# Patient Record
Sex: Male | Born: 1950 | Race: White | Hispanic: No | Marital: Single
Health system: Southern US, Community
[De-identification: ages and names within clinical notes are randomized; demographics above are authoritative.]

---

## 2011-11-09 ENCOUNTER — Inpatient Hospital Stay: Payer: Self-pay | Admitting: Psychiatry

## 2011-11-09 LAB — DRUG SCREEN, URINE
Amphetamines, Ur Screen: NEGATIVE (ref ?–1000)
Barbiturates, Ur Screen: NEGATIVE (ref ?–200)
Benzodiazepine, Ur Scrn: NEGATIVE (ref ?–200)
Cocaine Metabolite,Ur ~~LOC~~: NEGATIVE (ref ?–300)
MDMA (Ecstasy)Ur Screen: NEGATIVE (ref ?–500)
Opiate, Ur Screen: NEGATIVE (ref ?–300)

## 2011-11-09 LAB — COMPREHENSIVE METABOLIC PANEL
Albumin: 4.3 g/dL (ref 3.4–5.0)
Alkaline Phosphatase: 59 U/L (ref 50–136)
Anion Gap: 9 (ref 7–16)
Bilirubin,Total: 0.6 mg/dL (ref 0.2–1.0)
Calcium, Total: 9.4 mg/dL (ref 8.5–10.1)
Co2: 29 mmol/L (ref 21–32)
Creatinine: 1.01 mg/dL (ref 0.60–1.30)
EGFR (Non-African Amer.): >60
Osmolality: 284 (ref 275–301)
SGOT(AST): 23 U/L (ref 15–37)
SGPT (ALT): 21 U/L
Sodium: 142 mmol/L (ref 136–145)

## 2011-11-09 LAB — SALICYLATE LEVEL: Salicylates, Serum: <1.7 mg/dL

## 2011-11-09 LAB — CBC
HCT: 44.2 % (ref 40.0–52.0)
MCH: 30.9 pg (ref 26.0–34.0)
MCHC: 33.8 g/dL (ref 32.0–36.0)
MCV: 91 fL (ref 80–100)
Platelet: 225 10*3/uL (ref 150–440)
RDW: 13.8 % (ref 11.5–14.5)

## 2011-11-09 LAB — URINALYSIS, COMPLETE
Glucose,UR: NEGATIVE mg/dL (ref 0–75)
Leukocyte Esterase: NEGATIVE
Nitrite: NEGATIVE
Protein: NEGATIVE
Specific Gravity: 1.006 (ref 1.003–1.030)
WBC UR: <1 /HPF (ref 0–5)

## 2011-11-09 LAB — ETHANOL
Ethanol %: <0.003 % (ref 0.000–0.080)
Ethanol: <3 mg/dL

## 2011-11-09 LAB — ACETAMINOPHEN LEVEL: Acetaminophen: <2 ug/mL

## 2011-11-09 LAB — TSH: Thyroid Stimulating Horm: 1.53 u[IU]/mL

## 2011-11-27 LAB — CBC WITH DIFFERENTIAL/PLATELET
Eosinophil %: 3.1 %
Lymphocyte %: 14.1 %
Monocyte %: 4.7 %
Neutrophil %: 77 %
Platelet: 264 10*3/uL (ref 150–440)
RBC: 4.47 10*6/uL (ref 4.40–5.90)
WBC: 8.8 10*3/uL (ref 3.8–10.6)

## 2011-11-27 LAB — URINALYSIS, COMPLETE
Glucose,UR: NEGATIVE mg/dL (ref 0–75)
Ketone: NEGATIVE
Nitrite: NEGATIVE
RBC,UR: NONE SEEN /HPF (ref 0–5)
Specific Gravity: 1.003 (ref 1.003–1.030)
Squamous Epithelial: NONE SEEN
WBC UR: <1 /HPF (ref 0–5)

## 2011-11-27 LAB — BASIC METABOLIC PANEL
Anion Gap: 8 (ref 7–16)
BUN: 13 mg/dL (ref 7–18)
Calcium, Total: 9.1 mg/dL (ref 8.5–10.1)
Creatinine: 1.1 mg/dL (ref 0.60–1.30)
EGFR (African American): >60
EGFR (Non-African Amer.): >60
Osmolality: 278 (ref 275–301)
Sodium: 139 mmol/L (ref 136–145)

## 2011-12-17 ENCOUNTER — Inpatient Hospital Stay: Payer: Self-pay | Admitting: Psychiatry

## 2011-12-17 LAB — CBC
Platelet: 246 10*3/uL (ref 150–440)
RBC: 4.93 10*6/uL (ref 4.40–5.90)
RDW: 13.3 % (ref 11.5–14.5)
WBC: 13.1 10*3/uL — ABNORMAL HIGH (ref 3.8–10.6)

## 2011-12-17 LAB — URINALYSIS, COMPLETE
Bacteria: NONE SEEN
Bilirubin,UR: NEGATIVE
Blood: NEGATIVE
Ketone: NEGATIVE
Ph: 5 (ref 4.5–8.0)
Protein: NEGATIVE
RBC,UR: 1 /HPF (ref 0–5)
Specific Gravity: 1.012 (ref 1.003–1.030)
Squamous Epithelial: NONE SEEN
WBC UR: NONE SEEN /HPF (ref 0–5)

## 2011-12-17 LAB — COMPREHENSIVE METABOLIC PANEL
Anion Gap: 12 (ref 7–16)
Bilirubin,Total: 0.3 mg/dL (ref 0.2–1.0)
Co2: 29 mmol/L (ref 21–32)
Creatinine: 1.12 mg/dL (ref 0.60–1.30)
EGFR (African American): >60
EGFR (Non-African Amer.): >60
Glucose: 102 mg/dL — ABNORMAL HIGH (ref 65–99)
Osmolality: 285 (ref 275–301)
Sodium: 142 mmol/L (ref 136–145)
Total Protein: 8 g/dL (ref 6.4–8.2)

## 2011-12-17 LAB — DRUG SCREEN, URINE
Amphetamines, Ur Screen: NEGATIVE (ref ?–1000)
Benzodiazepine, Ur Scrn: NEGATIVE (ref ?–200)
Cocaine Metabolite,Ur ~~LOC~~: NEGATIVE (ref ?–300)
MDMA (Ecstasy)Ur Screen: NEGATIVE (ref ?–500)

## 2011-12-17 LAB — ACETAMINOPHEN LEVEL: Acetaminophen: <2 ug/mL

## 2011-12-17 LAB — ETHANOL: Ethanol %: <0.003 % (ref 0.000–0.080)

## 2011-12-31 ENCOUNTER — Ambulatory Visit: Payer: Self-pay | Admitting: Unknown Physician Specialty

## 2012-01-19 ENCOUNTER — Ambulatory Visit: Payer: Self-pay | Admitting: Psychiatry

## 2012-02-18 ENCOUNTER — Ambulatory Visit: Payer: Self-pay | Admitting: Psychiatry

## 2012-03-20 ENCOUNTER — Ambulatory Visit: Payer: Self-pay | Admitting: Psychiatry

## 2012-04-05 IMAGING — CR DG CHEST 2V
1 series · 3 of 3 positions shown · non-contrast
Comparison: none

REASON FOR EXAM: ECT workup
COMMENTS:

PROCEDURE:     DXR - DXR CHEST PA (OR AP) AND LATERAL  - November 27, 2011  [DATE]
RESULT:     The lung fields are clear. No pneumonia, pneumothorax or pleural
effusion is seen. Heart size is normal. No acute bony abnormalities are
seen. No vertebral body fracture is observed.

[Series 1: pa · 0.17mm/px · 3 of 3 slices shown]
[im 1/3]
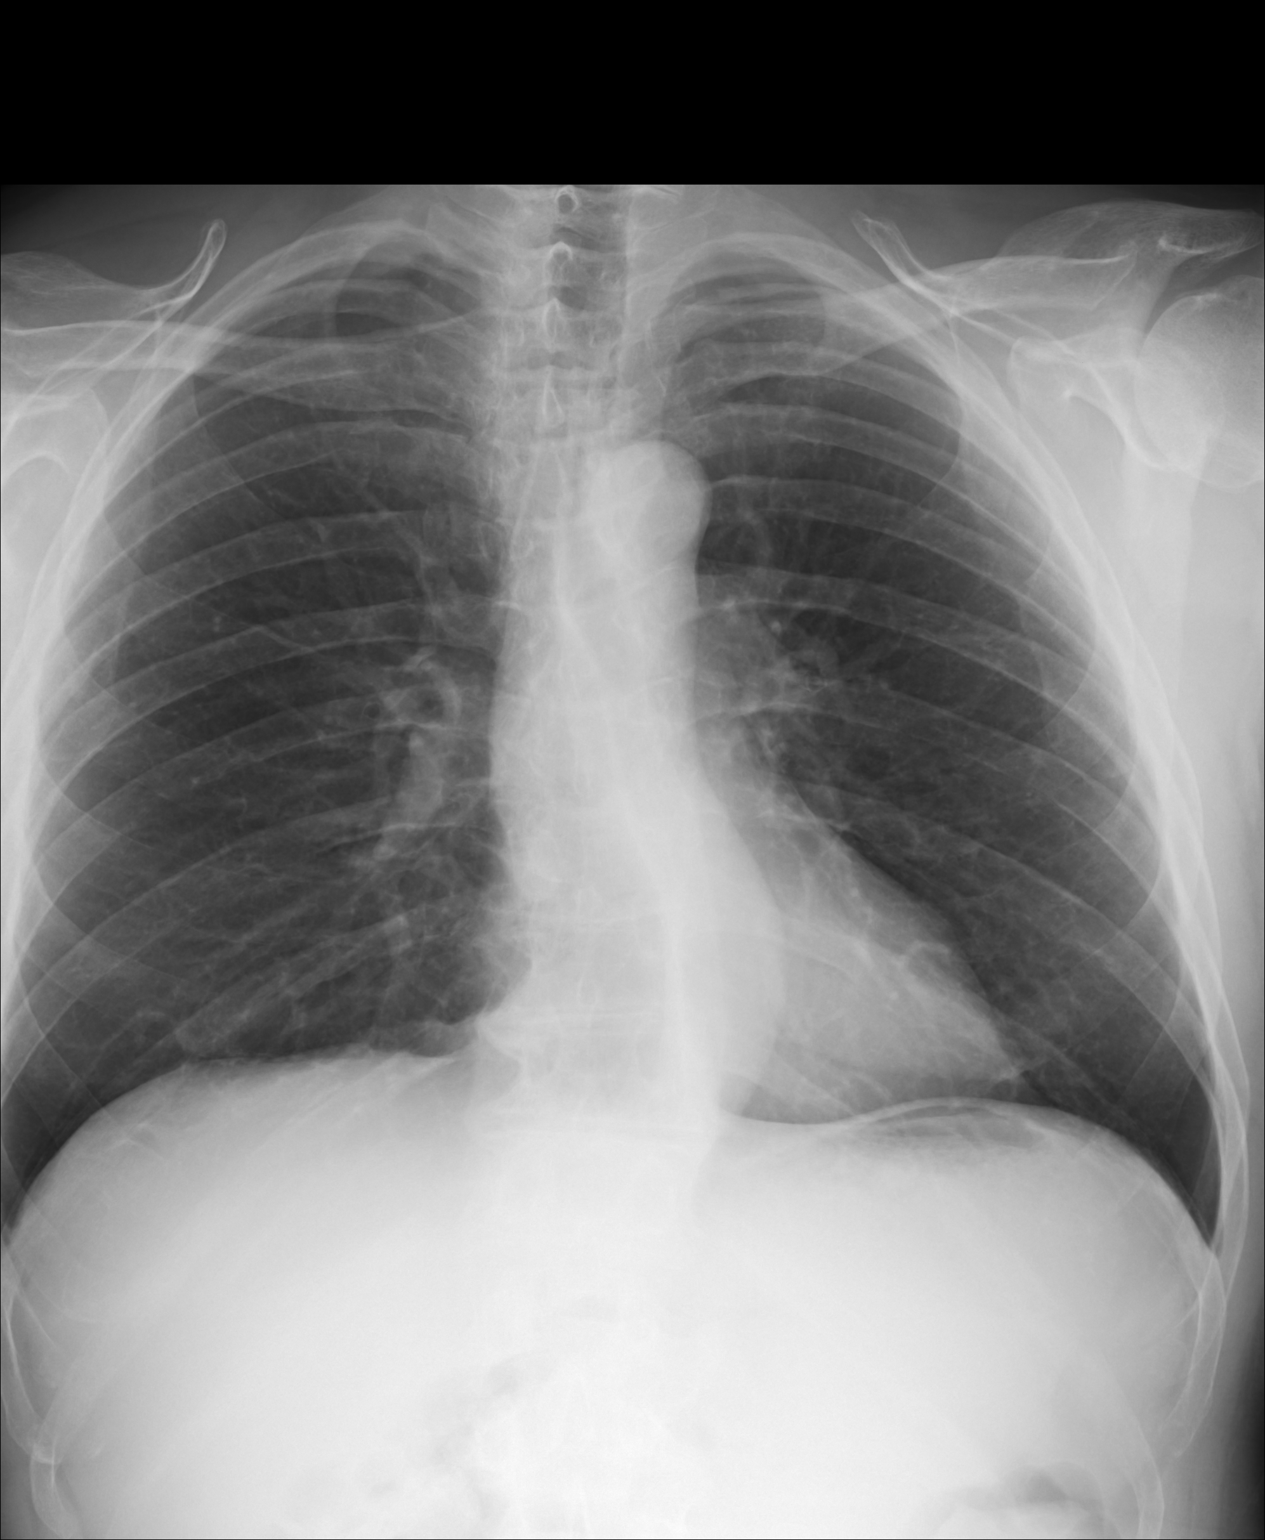
[im 2/3]
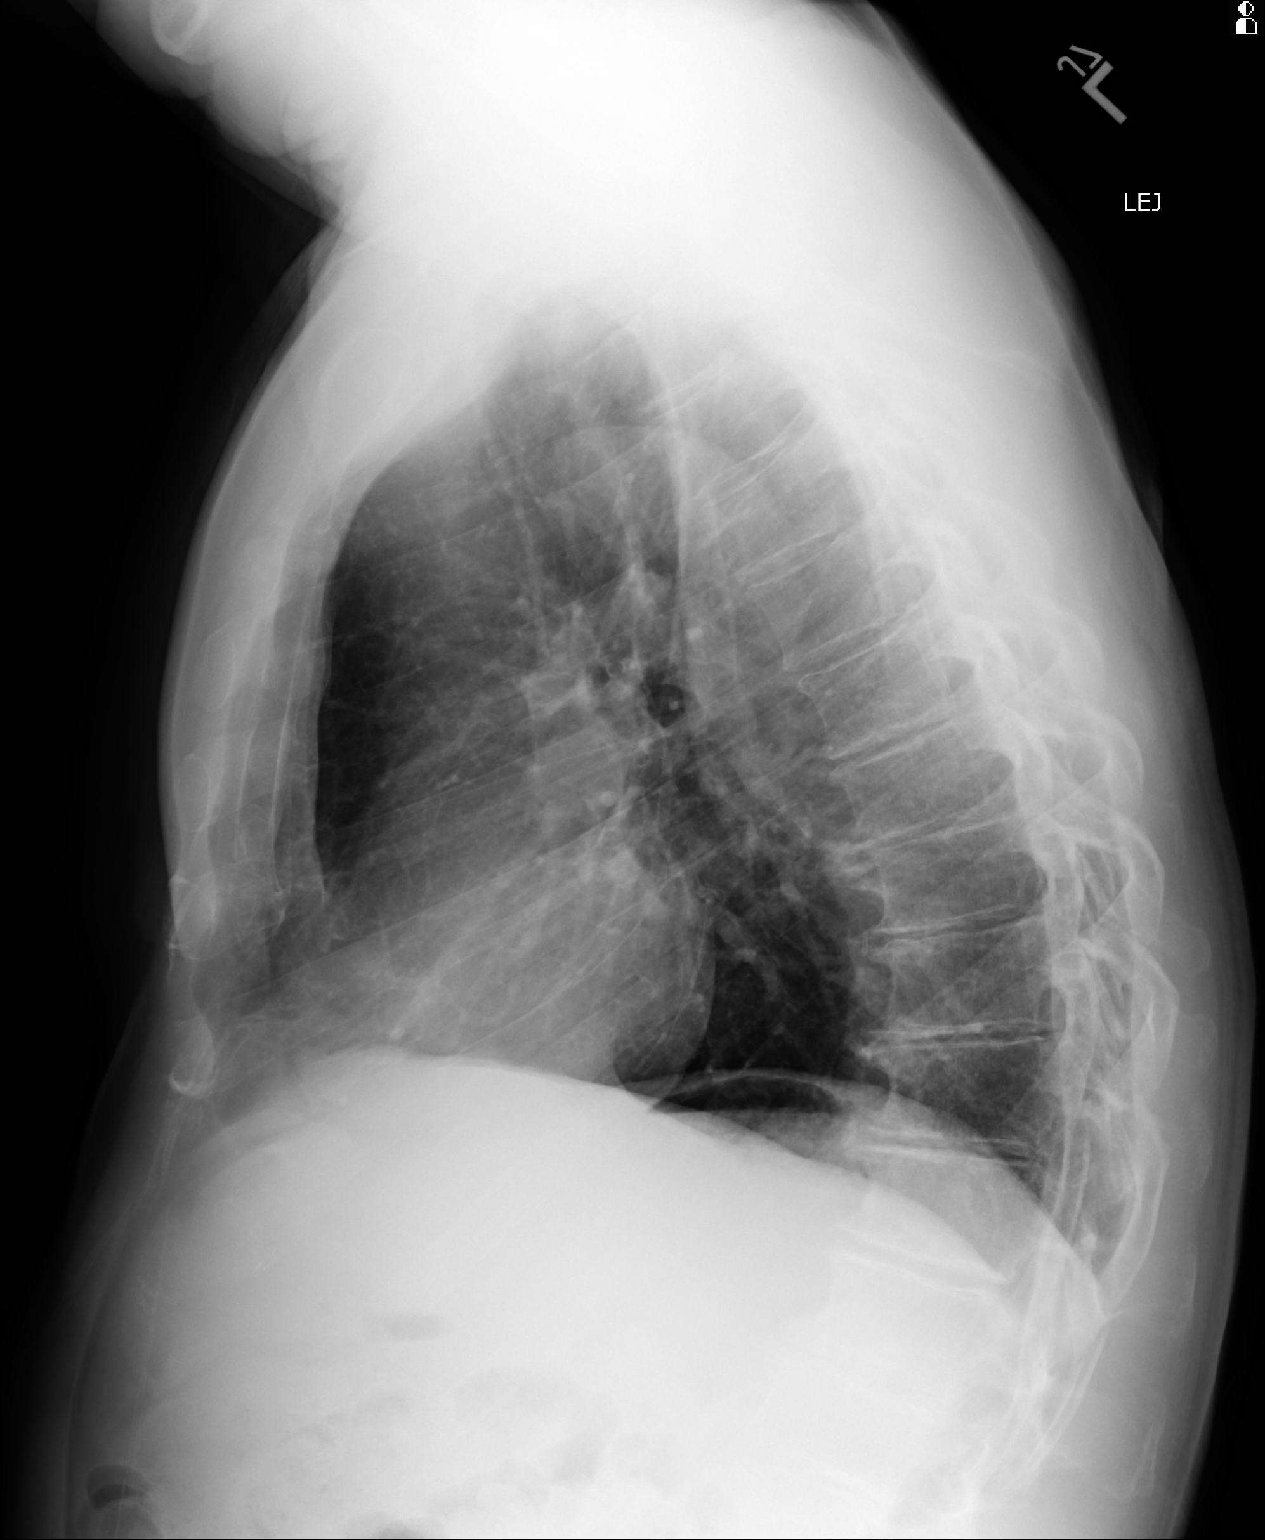
[im 3/3]
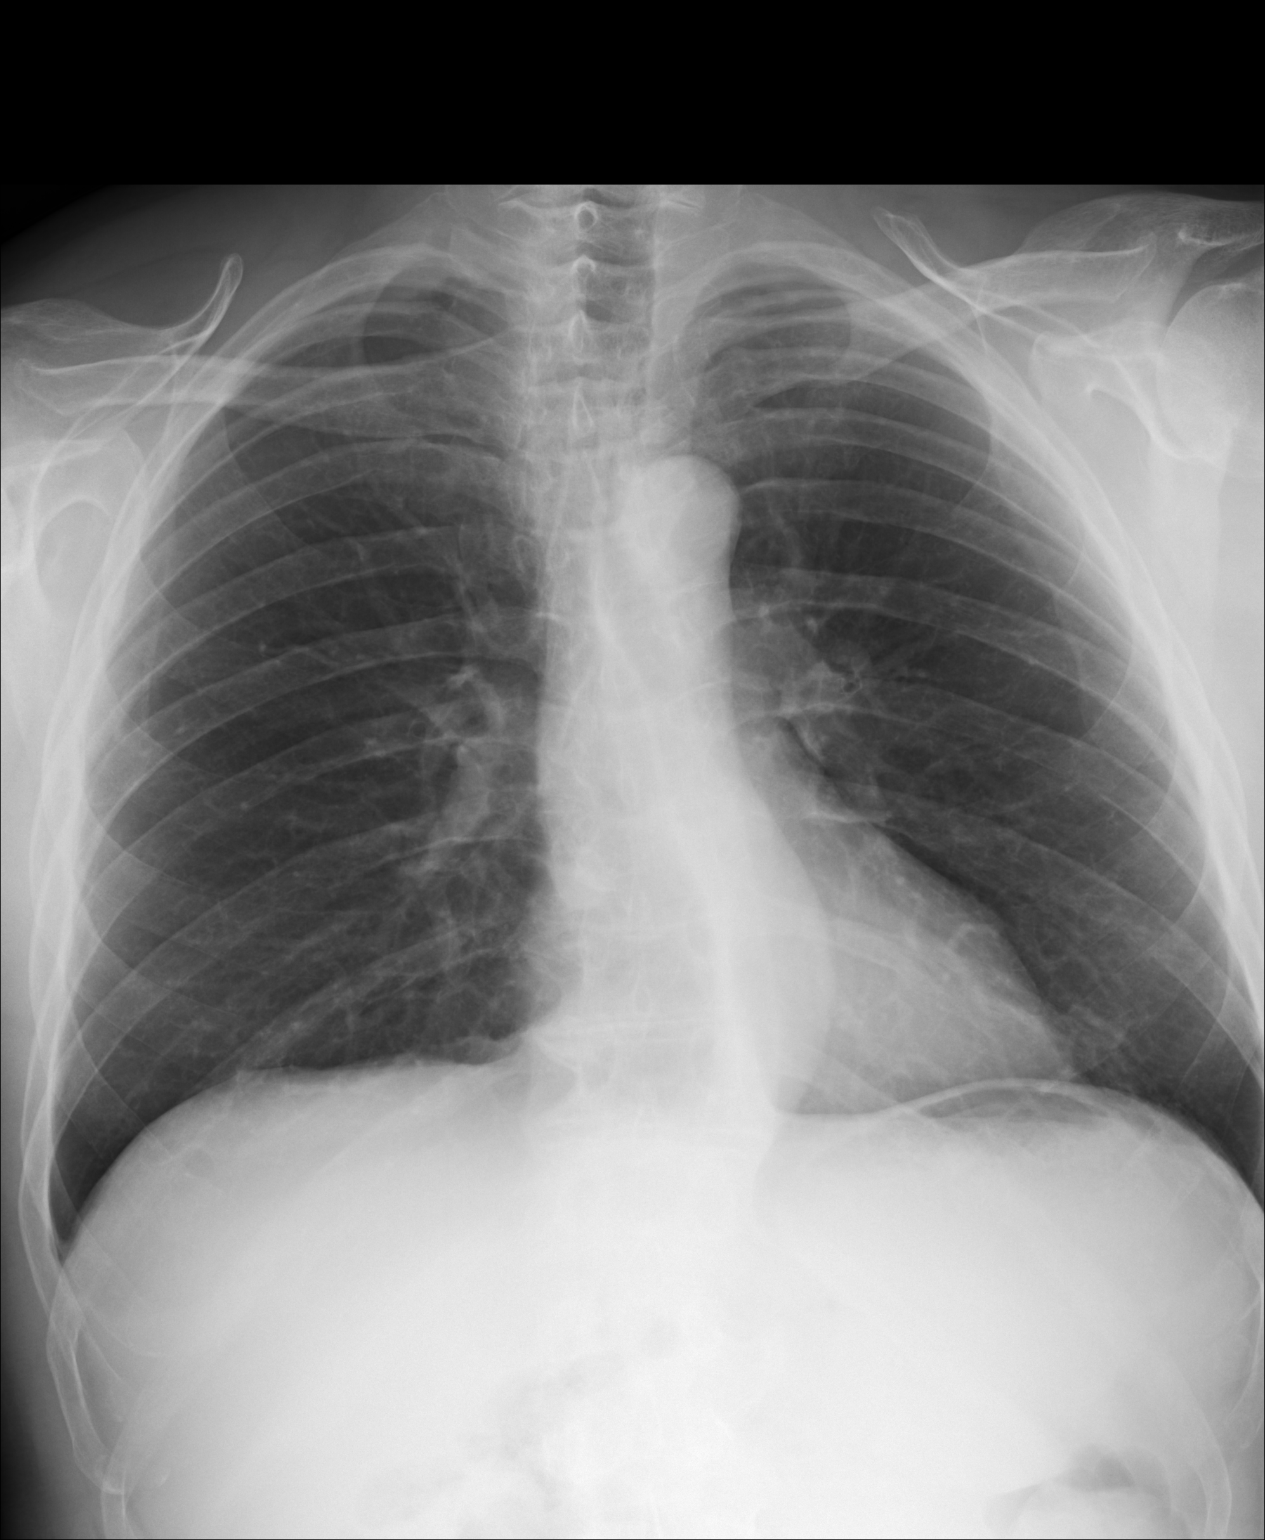

[3 of 3 positions shown; findings below may reference images not displayed]

IMPRESSION: No acute changes are identified.

## 2012-05-05 ENCOUNTER — Ambulatory Visit: Payer: Self-pay | Admitting: Psychiatry

## 2012-05-12 ENCOUNTER — Ambulatory Visit: Payer: Self-pay | Admitting: Emergency Medicine

## 2012-05-12 LAB — HEPATIC FUNCTION PANEL A (ARMC)
SGOT(AST): 15 U/L (ref 15–37)
SGPT (ALT): 20 U/L
Total Protein: 7.3 g/dL (ref 6.4–8.2)

## 2012-05-18 ENCOUNTER — Ambulatory Visit: Payer: Self-pay | Admitting: Emergency Medicine

## 2012-05-21 ENCOUNTER — Inpatient Hospital Stay: Payer: Self-pay | Admitting: Emergency Medicine

## 2012-05-21 LAB — COMPREHENSIVE METABOLIC PANEL
Alkaline Phosphatase: 92 U/L (ref 50–136)
BUN: 13 mg/dL (ref 7–18)
Bilirubin,Total: 0.4 mg/dL (ref 0.2–1.0)
Chloride: 103 mmol/L (ref 98–107)
Co2: 28 mmol/L (ref 21–32)
Creatinine: 1.32 mg/dL — ABNORMAL HIGH (ref 0.60–1.30)
EGFR (African American): >60
Osmolality: 284 (ref 275–301)
Potassium: 4 mmol/L (ref 3.5–5.1)
SGPT (ALT): 19 U/L
Sodium: 141 mmol/L (ref 136–145)
Total Protein: 7.5 g/dL (ref 6.4–8.2)

## 2012-05-21 LAB — URINALYSIS, COMPLETE
Bacteria: NONE SEEN
Bilirubin,UR: NEGATIVE
Glucose,UR: NEGATIVE mg/dL (ref 0–75)
Leukocyte Esterase: NEGATIVE
Ph: 6 (ref 4.5–8.0)
Protein: NEGATIVE
RBC,UR: <1 /HPF (ref 0–5)
Specific Gravity: 1.014 (ref 1.003–1.030)
Squamous Epithelial: NONE SEEN
WBC UR: 1 /HPF (ref 0–5)

## 2012-05-21 LAB — CBC
HCT: 41.4 % (ref 40.0–52.0)
MCH: 29.8 pg (ref 26.0–34.0)
MCHC: 34.4 g/dL (ref 32.0–36.0)
MCV: 87 fL (ref 80–100)
RBC: 4.77 10*6/uL (ref 4.40–5.90)
RDW: 14.4 % (ref 11.5–14.5)
WBC: 15.5 10*3/uL — ABNORMAL HIGH (ref 3.8–10.6)

## 2012-05-21 LAB — LIPASE, BLOOD: Lipase: 102 U/L (ref 73–393)

## 2012-05-24 LAB — CBC WITH DIFFERENTIAL/PLATELET
Basophil #: 0 10*3/uL (ref 0.0–0.1)
Basophil %: 0.3 %
Eosinophil %: 4.7 %
HCT: 34.9 % — ABNORMAL LOW (ref 40.0–52.0)
HGB: 12.3 g/dL — ABNORMAL LOW (ref 13.0–18.0)
Lymphocyte #: 1.3 10*3/uL (ref 1.0–3.6)
MCV: 87 fL (ref 80–100)
Monocyte #: 0.6 x10 3/mm (ref 0.2–1.0)
Neutrophil #: 6.7 10*3/uL — ABNORMAL HIGH (ref 1.4–6.5)
Platelet: 190 10*3/uL (ref 150–440)
RBC: 4.02 10*6/uL — ABNORMAL LOW (ref 4.40–5.90)
RDW: 14.1 % (ref 11.5–14.5)
WBC: 9 10*3/uL (ref 3.8–10.6)

## 2012-05-24 LAB — HEPATIC FUNCTION PANEL A (ARMC)
Albumin: 2.8 g/dL — ABNORMAL LOW (ref 3.4–5.0)
Bilirubin, Direct: 0.3 mg/dL — ABNORMAL HIGH (ref 0.00–0.20)
SGPT (ALT): 48 U/L (ref 12–78)

## 2012-06-10 ENCOUNTER — Observation Stay: Payer: Self-pay | Admitting: Emergency Medicine

## 2012-06-10 LAB — CBC
HGB: 13.2 g/dL (ref 13.0–18.0)
MCH: 29 pg (ref 26.0–34.0)
MCV: 87 fL (ref 80–100)
Platelet: 505 10*3/uL — ABNORMAL HIGH (ref 150–440)
RBC: 4.57 10*6/uL (ref 4.40–5.90)
RDW: 14.1 % (ref 11.5–14.5)
WBC: 10.1 10*3/uL (ref 3.8–10.6)

## 2012-06-10 LAB — COMPREHENSIVE METABOLIC PANEL
Albumin: 3.4 g/dL (ref 3.4–5.0)
Alkaline Phosphatase: 501 U/L — ABNORMAL HIGH (ref 50–136)
Anion Gap: 7 (ref 7–16)
Bilirubin,Total: 0.5 mg/dL (ref 0.2–1.0)
Calcium, Total: 9.6 mg/dL (ref 8.5–10.1)
Chloride: 102 mmol/L (ref 98–107)
Creatinine: 1.18 mg/dL (ref 0.60–1.30)
Osmolality: 276 (ref 275–301)
Potassium: 4 mmol/L (ref 3.5–5.1)
SGOT(AST): 31 U/L (ref 15–37)
Sodium: 138 mmol/L (ref 136–145)

## 2012-06-10 LAB — URINALYSIS, COMPLETE
Bilirubin,UR: NEGATIVE
Blood: NEGATIVE
Glucose,UR: NEGATIVE mg/dL (ref 0–75)
Hyaline Cast: 4
Ketone: NEGATIVE
Nitrite: NEGATIVE
Ph: 7 (ref 4.5–8.0)
Protein: 30
Specific Gravity: 1.012 (ref 1.003–1.030)
WBC UR: 2 /HPF (ref 0–5)

## 2012-06-10 LAB — LIPASE, BLOOD: Lipase: 218 U/L (ref 73–393)

## 2012-06-11 LAB — APTT: Activated PTT: 32.6 secs (ref 23.6–35.9)

## 2012-06-16 ENCOUNTER — Ambulatory Visit: Payer: Self-pay | Admitting: Psychiatry

## 2012-06-23 ENCOUNTER — Ambulatory Visit: Payer: Self-pay | Admitting: Psychiatry

## 2012-07-20 ENCOUNTER — Ambulatory Visit: Payer: Self-pay | Admitting: Psychiatry

## 2012-09-28 IMAGING — CR DG CHEST 1V PORT
1 series · 1 of 1 positions shown · non-contrast
Comparison: none

REASON FOR EXAM: pain
COMMENTS:

[portable]
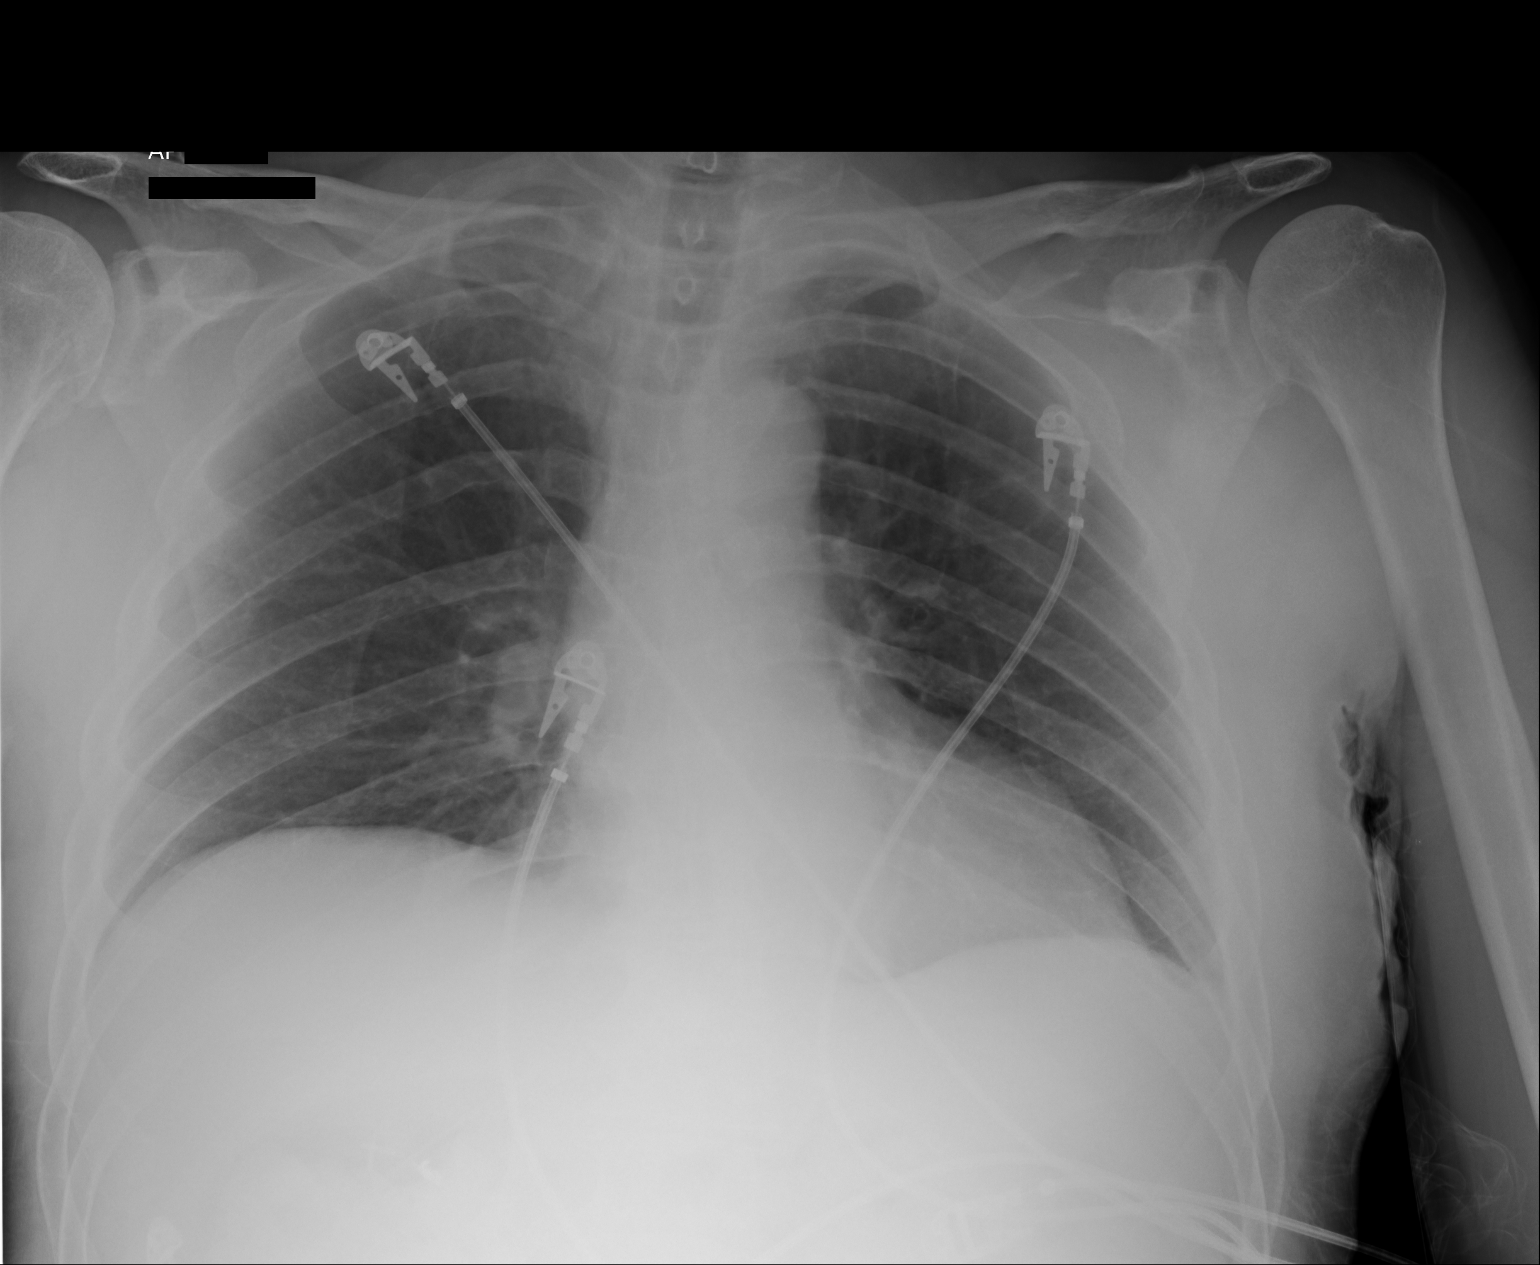

[1 of 1 positions shown; findings below may reference images not displayed]

PROCEDURE:     DXR - DXR PORTABLE CHEST SINGLE VIEW  - May 21, 2012  [DATE]

RESULT:     There is hypoinflation. The lungs are clear. The heart and
pulmonary vessels are normal. The bony and mediastinal structures are
unremarkable. There is no effusion. There is no pneumothorax or evidence of
congestive failure.
IMPRESSION: No acute cardiopulmonary disease.

[REDACTED]

## 2012-10-18 IMAGING — CT CT ABD-PELV W/ CM
1 of 2 series · 15 of 32 positions shown, 19 images · IV contrast (isovue)
Comparison: 05/21/2012

REASON FOR EXAM: (1) assess perihepatic fluid; (2) assess perihepatic
fluid
COMMENTS:

PROCEDURE:     CT  - CT ABDOMEN / PELVIS  W  - June 10, 2012  [DATE]
RESULT:     History: Perihepatic fluid
TECHNIQUE: Multiple axial images of the abdomen and pelvis were performed
from the lung bases to the pubic symphysis, without p.o. contrast and with
85 ml of Isovue 300 intravenous contrast.

[Series 2: 3mm soft tissue · axial · 0.75mm/px · z∈[-1097,-692]mm · 15 of 147 slices shown, 19 images]
[im 6/147  soft-tissue]
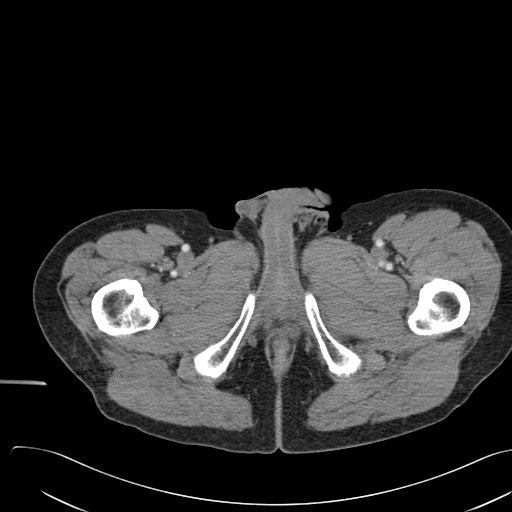
[im 6/147  bone]
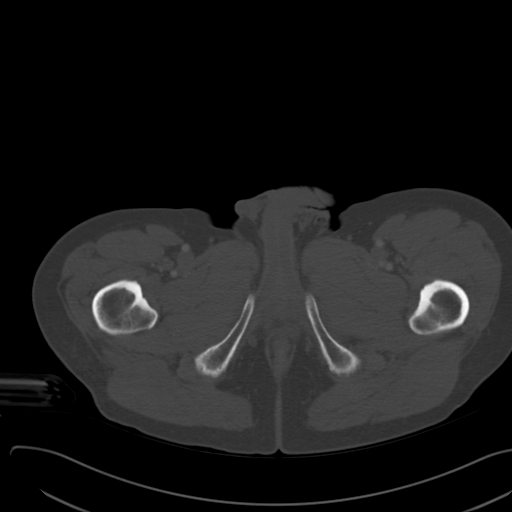
[im 18/147  soft-tissue]
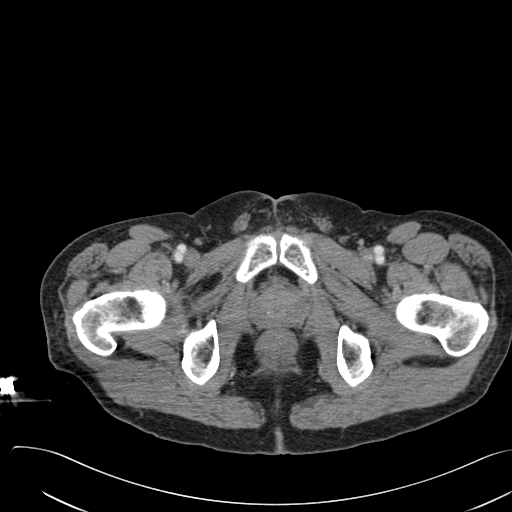
[im 30/147  soft-tissue]
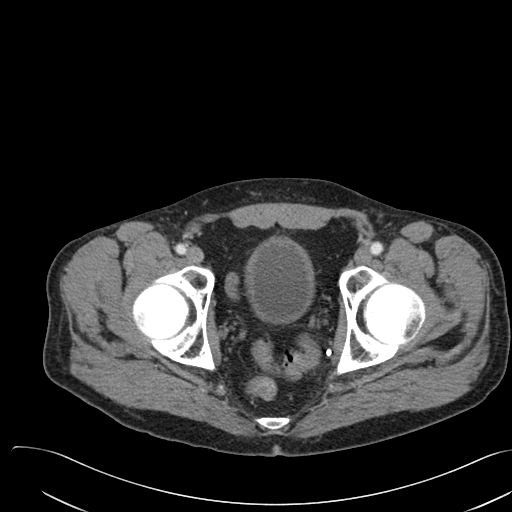
[im 41/147  soft-tissue]
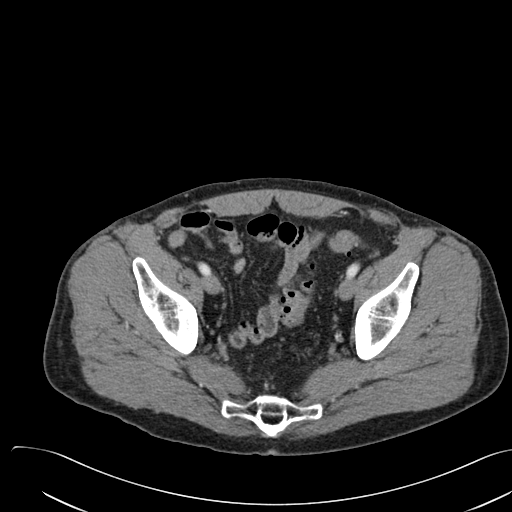
[im 53/147  soft-tissue]
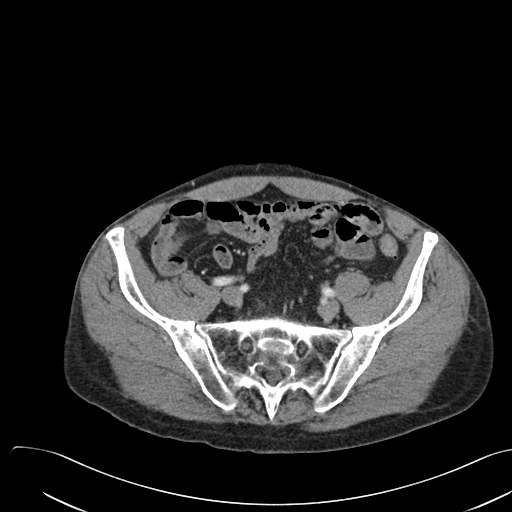
[im 65/147  soft-tissue]
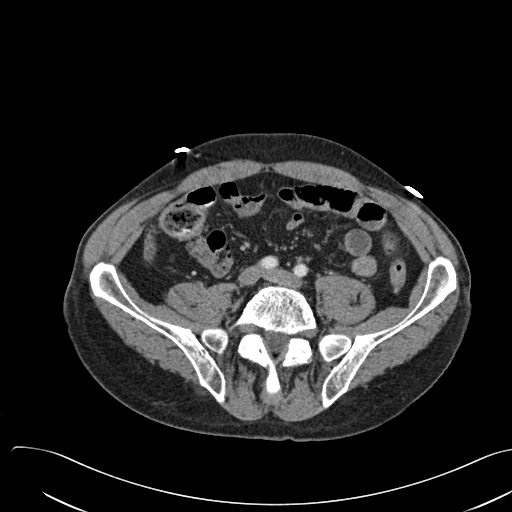
[im 76/147  soft-tissue]
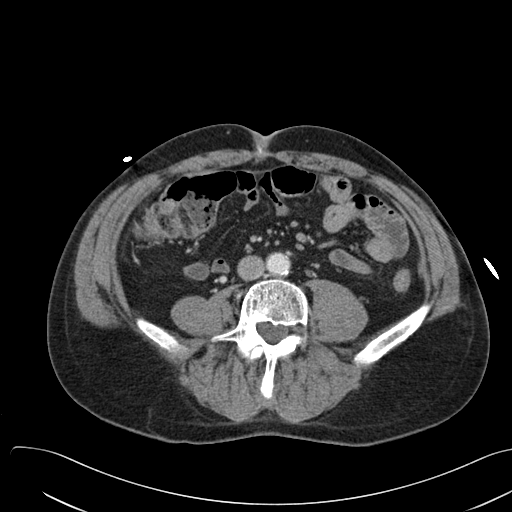
[im 82/147  soft-tissue]
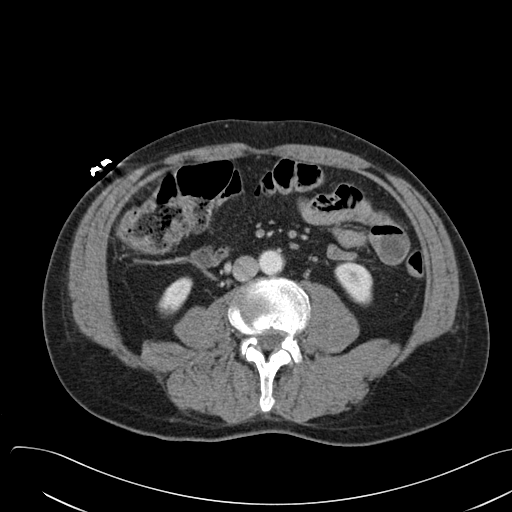
[im 94/147  soft-tissue]
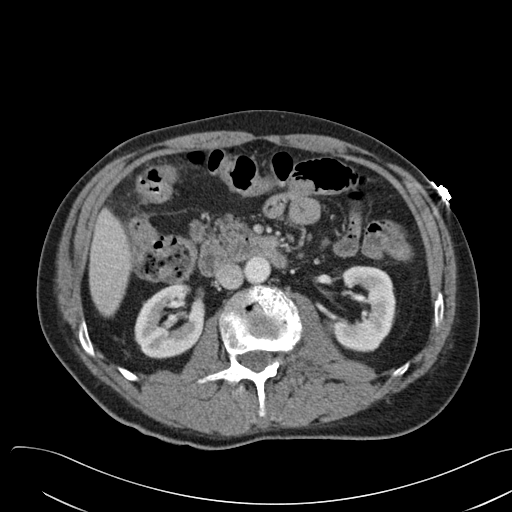
[im 94/147  bone]
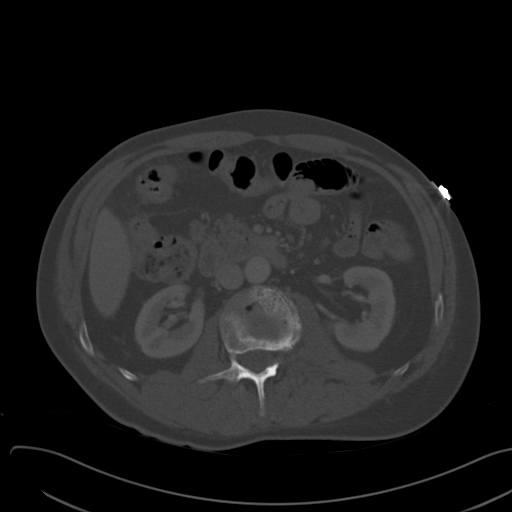
[im 106/147  soft-tissue]
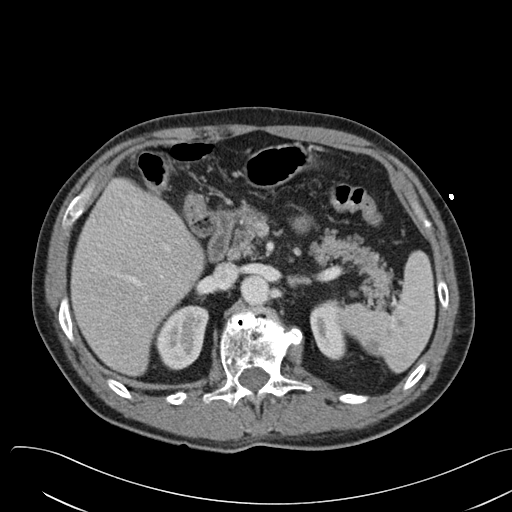
[im 117/147  soft-tissue]
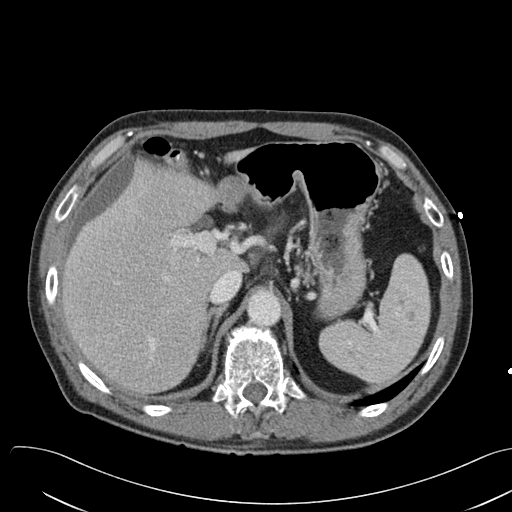
[im 123/147  lung]
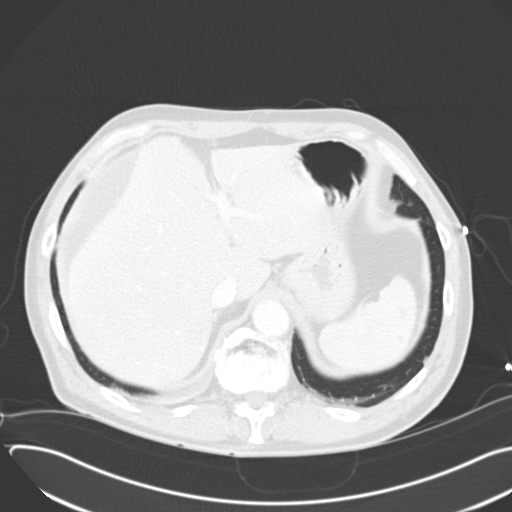
[im 129/147  soft-tissue]
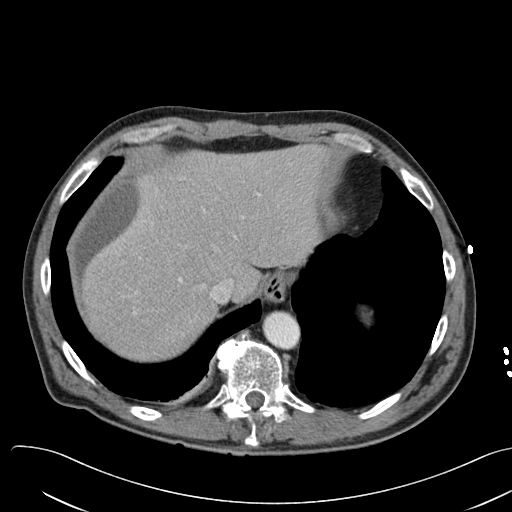
[im 129/147  lung]
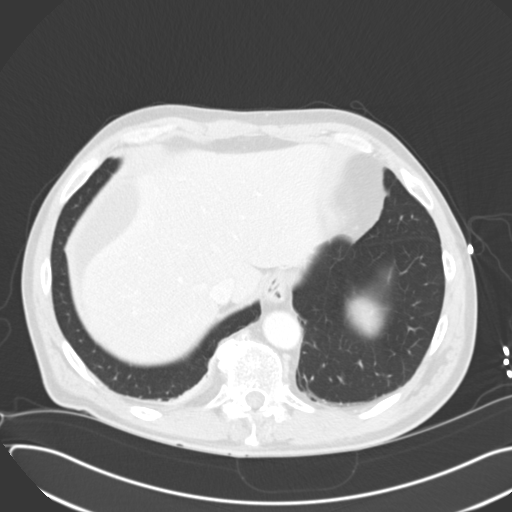
[im 135/147  lung]
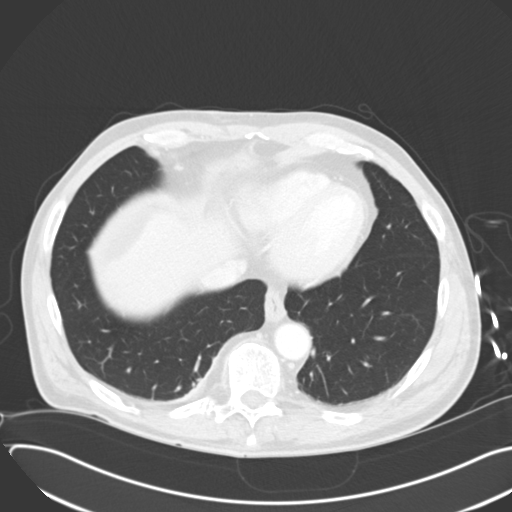
[im 141/147  soft-tissue]
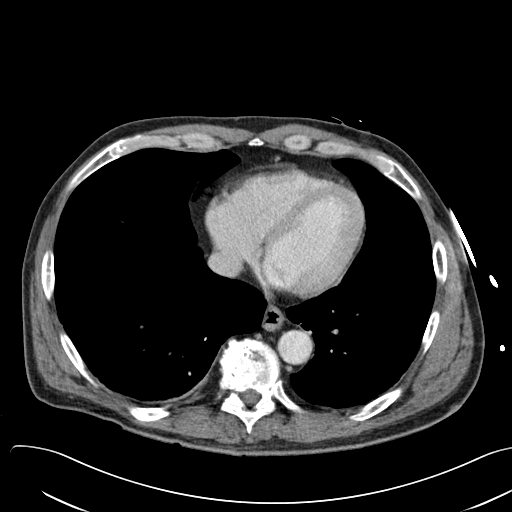
[im 141/147  lung]
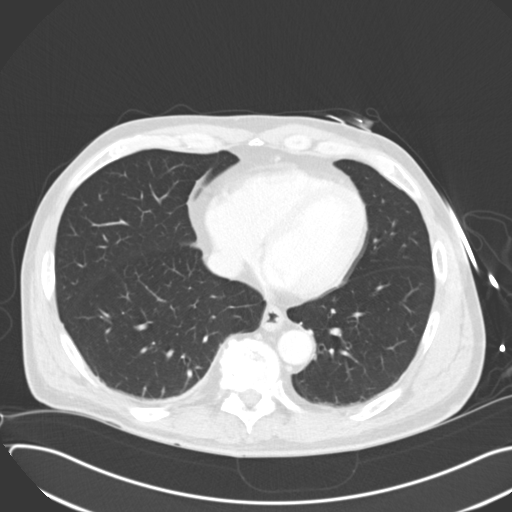

[15 of 32 positions shown; findings below may reference images not displayed]

FINDINGS: The lung bases are clear. There is no pneumothorax. The heart size is
normal.

The liver demonstrates no focal abnormality. There is no intrahepatic or
extrahepatic biliary ductal dilatation. The gallbladder is surgically
absent. There is perihepatic fluid located anterolaterally deforming the
liver capsule suggesting that the fluid is loculate, measuring 10.5 x
cm. The spleen demonstrates no focal abnormality. The kidneys, adrenal
glands, and pancreas are normal. The bladder is unremarkable.

The stomach, duodenum, small intestine, and large intestine demonstrate no
gross abnormality. There is a normal caliber appendix in the right lower
quadrant without periappendiceal inflammatory changes. There is
diverticulosis without evidence of diverticulitis. There is no
pneumoperitoneum, pneumatosis, or portal venous gas. There is no abdominal
or pelvic free fluid. There is no lymphadenopathy.

The abdominal aorta is normal in caliber with atherosclerosis.

The osseous structures are unremarkable.
IMPRESSION: 1. There is perihepatic fluid located anterolaterally deforming the liver
capsule suggesting that the fluid is loculated, measuring 10.5 x 1.9 cm.
This may represent a loculated biloma versus free fluid versus an abscess.
There is no abdominal free fluid.

[REDACTED]

## 2012-10-18 IMAGING — US ABDOMEN ULTRASOUND LIMITED
1 series · 14 of 25 positions shown · non-contrast
Comparison: none

REASON FOR EXAM: s/p complicated cholecystectomy, very elevated alk phos
COMMENTS:   Body Site: GB and Fossa, CBD, Head of Pancreas

PROCEDURE:     US  - US ABDOMEN LIMITED SURVEY  - June 10, 2012  [DATE]
RESULT:     Comparison: None
TECHNIQUE: Multiple gray-scale and color-flow Doppler images of the right
upper quadrant are presented for review.

[Series 1: abdomen ultrasound limited · 0.25mm/px · 42 acquisitions, 14 frames shown]
[im 1/42]
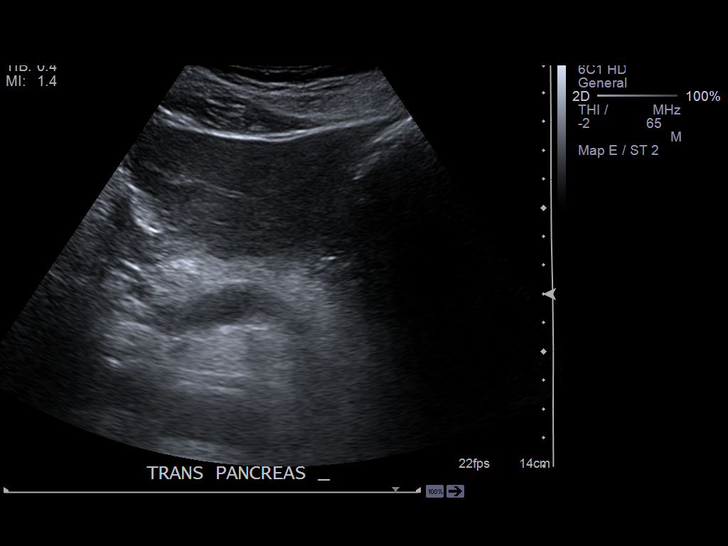
[im 4/42]
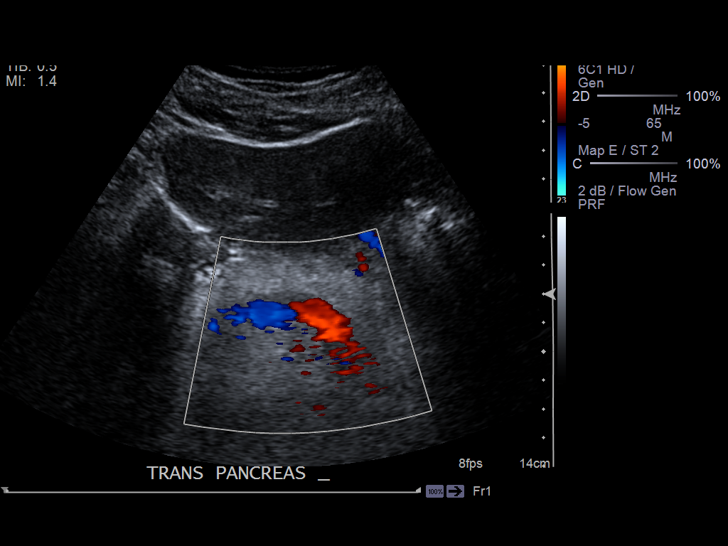
[im 7/42]
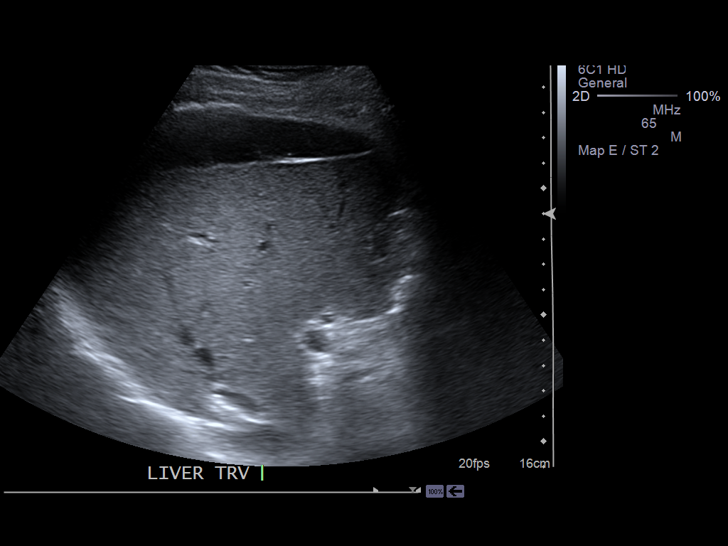
[im 11/42]
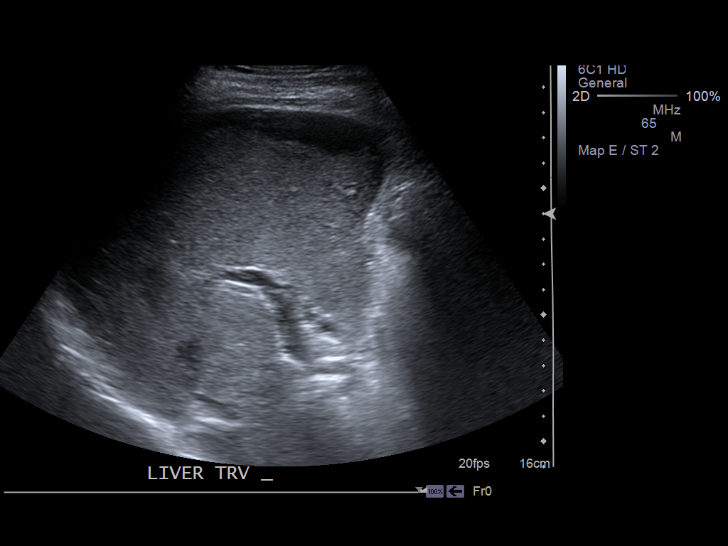
[im 14/42]
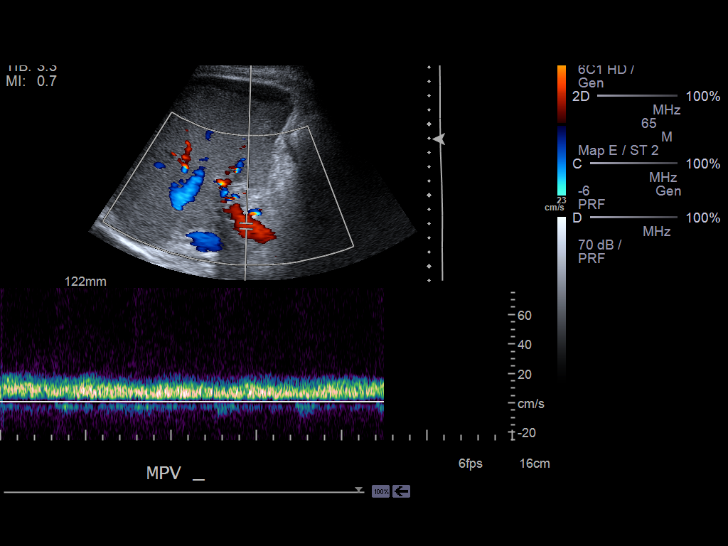
[im 16/42]
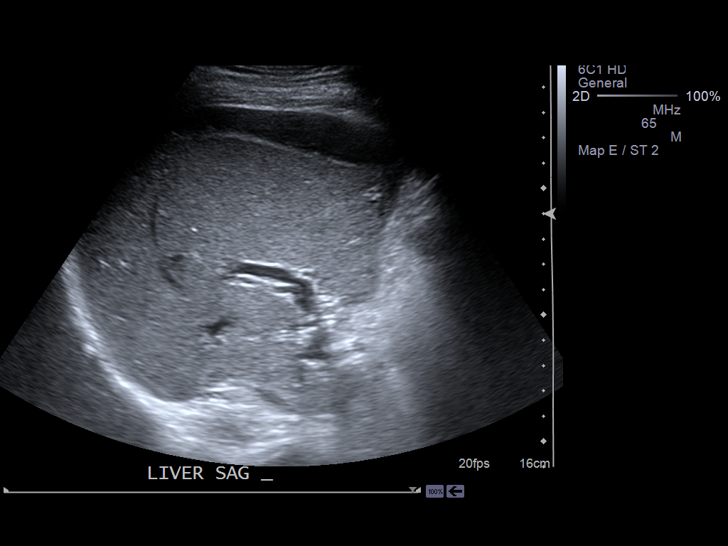
[im 19/42]
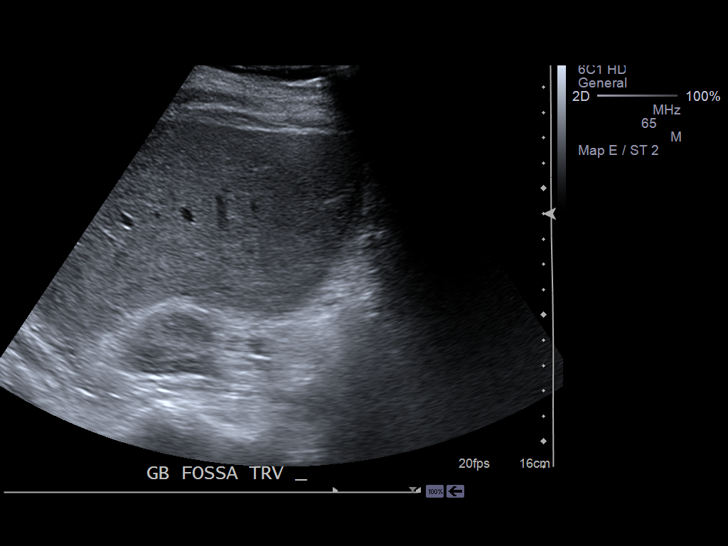
[im 23/42]
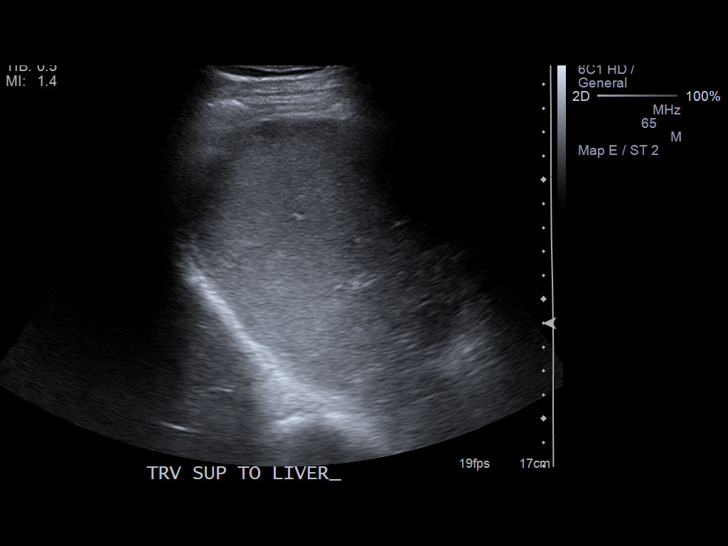
[im 26/42]
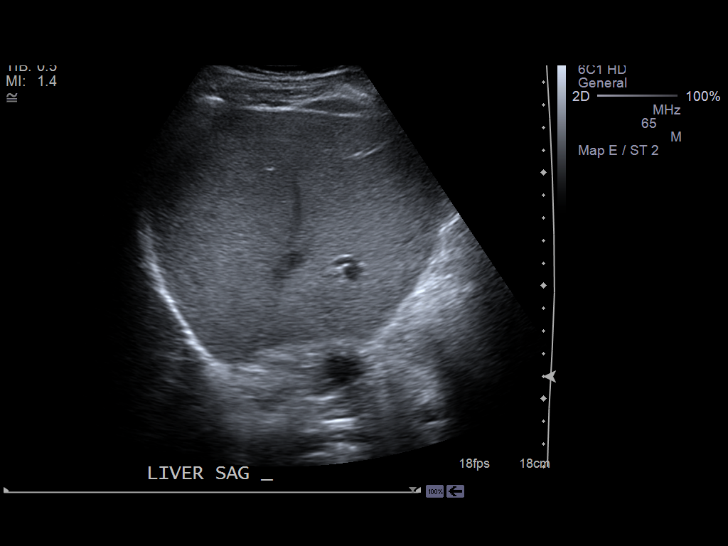
[im 28/42]
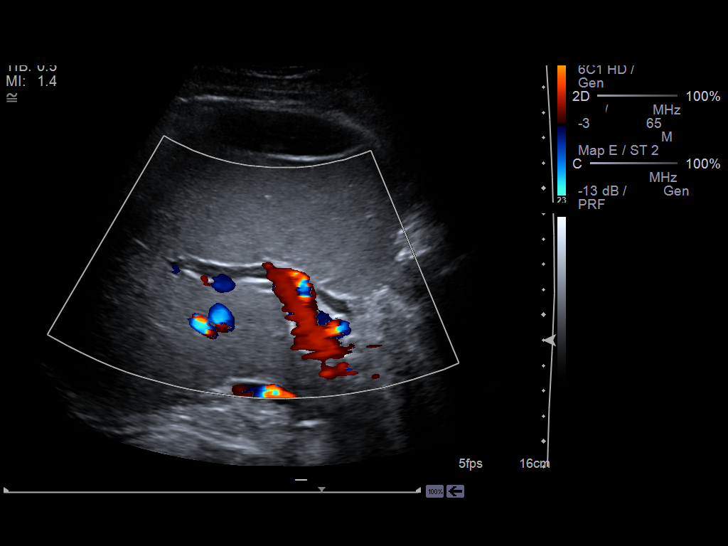
[im 31/42]
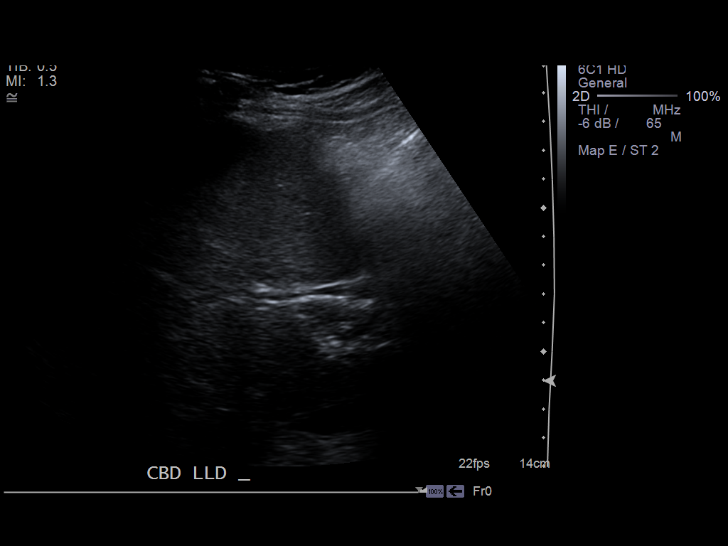
[im 35/42]
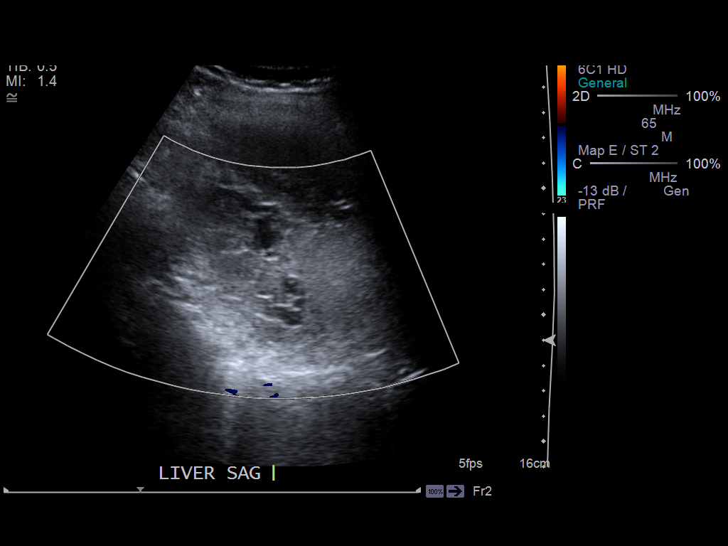
[im 38/42]
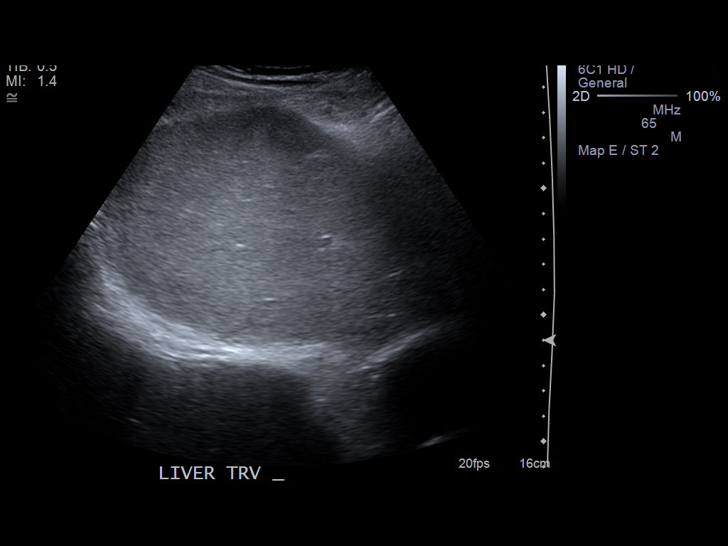
[im 42/42]
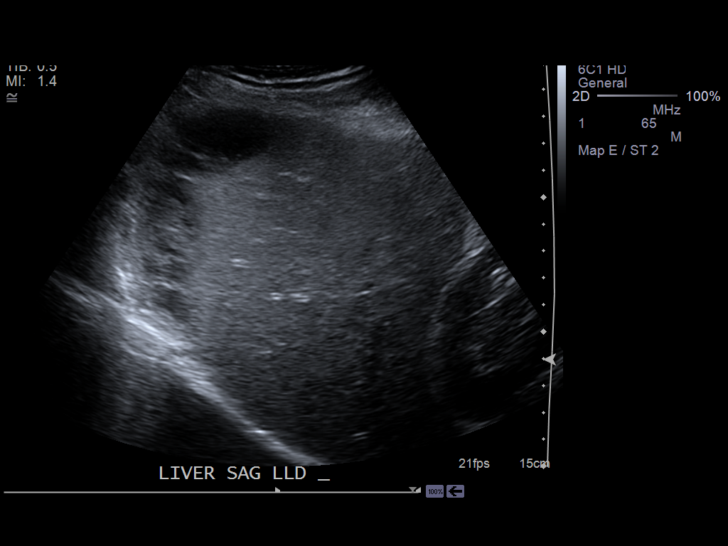

[14 of 25 positions shown; findings below may reference images not displayed]

FINDINGS: Visualized portions of the liver demonstrate normal echogenicity and normal
contours. The liver is without evidence of a focal hepatic lesion.

There is no intra- or extrahepatic biliary ductal dilatation. The common
duct measures 4.2 mm in maximal diameter. Patient is status post
cholecystectomy. There is perihepatic free fluid with a few internal
septations.

The visualized portion of the pancreas is normal in echogenicity.
IMPRESSION: Prior cholecystectomy. There is perihepatic free fluid which is nonspecific
and may represent simple ascites versus mild versus abscess. Further
evaluation with a CT of the abdomen and possible HIDA scan is recommended.

[REDACTED]

## 2015-02-06 NOTE — Consult Note (Signed)
Chief Complaint:   Subjective/Chief Complaint mild nausea, minimal pain when lying in the bed, some epigastric pain on movement, no emesis.   VITAL SIGNS/ANCILLARY NOTES: **Vital Signs.:   04-Aug-13 05:36   Vital Signs Type Routine   Temperature Temperature (F) 98.2   Celsius 36.7   Temperature Source Oral   Pulse Pulse 77   Respirations Respirations 18   Systolic BP Systolic BP 127   Diastolic BP (mmHg) Diastolic BP (mmHg) 83   Mean BP 97   Pulse Ox % Pulse Ox % 93   Pulse Ox Activity Level  At rest   Oxygen Delivery Room Air/ 21 %    14:28   Vital Signs Type Routine   Temperature Temperature (F) 98.7   Celsius 37   Temperature Source oral   Pulse Pulse 80   Respirations Respirations 17   Systolic BP Systolic BP 132   Diastolic BP (mmHg) Diastolic BP (mmHg) 84   Mean BP 100   Pulse Ox % Pulse Ox % 96   Pulse Ox Activity Level  At rest   Oxygen Delivery Room Air/ 21 %   Brief Assessment:   Cardiac Regular    Respiratory clear BS    Gastrointestinal details normal Soft  Nondistended  No masses palpable  Bowel sounds normal  No rebound tenderness  mild ruq and epigastric tenderness to palpation.   Assessment/Plan:  Assessment/Plan:   Assessment 1) abdominal pain post lccy, probable bile duct leak.  failed ercp due to swollen ampulla.  patient about the same as yesterday.    Plan 1) will check labs in am, will discuss case with Dr Bluford Kaufmannh in am. continue current.   Electronic Signatures: Barnetta ChapelSkulskie, Darrill Vreeland (MD)  (Signed 04-Aug-13 19:13)  Authored: Chief Complaint, VITAL SIGNS/ANCILLARY NOTES, Brief Assessment, Assessment/Plan   Last Updated: 04-Aug-13 19:13 by Barnetta ChapelSkulskie, Oletta Buehring (MD)

## 2015-02-06 NOTE — Op Note (Signed)
PATIENT NAME:  Omar Foster, Omar Foster MR#:  [id number] DATE OF BIRTH:  [birth year]  DATE OF PROCEDURE:  05/18/2012  PREOPERATIVE DIAGNOSIS: Cholecystitis.  POSTOPERATIVE DIAGNOSIS: Cholecystitis.  PROCEDURES PERFORMED: Laparoscopic cholecystectomy with cholangiogram and repair of umbilical hernia. The patient's umbilical hernia was found during surgery.  SURGEON: Jovita GammaMasud Marlei Glomski, MD  INDICATION FOR SURGERY: This patient was having discomfort in the abdomen and was found by ultrasound to have gallstones. The patient was then brought to surgery.   DESCRIPTION OF PROCEDURE: After he was put to sleep, a small incision was made above the umbilicus. After cutting skin and subcutaneous tissue, the patient was found to have a large umbilical hernia which was then excised and dissected all the way down, and the fascia was then lifted up with 0 Vicryl sutures and a trocar was then put in under direct vision. CO2 was insufflated. Another trocar was put in the epigastric region, two 5 mm put in the right upper quadrant of the abdomen. The gallbladder was lifted up. He had a lot of adhesions which were then released slowly until we reached the lower end of the gallbladder. The cystic duct was visualized nicely. Cholangiogram was performed which showed a normal common bile duct and flow into the duodenum. The cystic artery was then clipped and cut. The cystic duct was then clipped and cut. The gallbladder was removed from the liver bed without any problem. After that irrigation of the liver bed was done which was good and all the trocars were removed under direct vision which were good. The trocar site was then closed with subcuticular stitches, especially in the epigastric region and two right upper quadrant incisions. At the umbilical port, the patient had a big defect because of umbilical hernia, so umbilical hernia repair was then         performed with 0 Vicryl interrupted sutures and after that skin was closed  with 4-0 Vicryl sutures. Marcaine was injected. The patient tolerated the procedure well and was sent to the recovery room in satisfactory condition.  ____________________________ Alton RevereMasud S. Cecelia ByarsHashmi, MD msh:slb D: 05/18/2012 11:18:31 ET T: 05/18/2012 12:41:48 ET JOB#: [id number]  cc: Gianni Fuchs S. Cecelia ByarsHashmi, MD, <Dictator> Meindert A. Lacie ScottsNiemeyer, MD Meryle ReadyMASUD S Gabriella Woodhead MD ELECTRONICALLY SIGNED 05/18/2012 15:07

## 2015-02-06 NOTE — H&P (Signed)
Subjective/Chief Complaint abdominal discomfort    History of Present Illness pt had laprascopic cholysysytectomy done and had a duct of [place]ka leak and now has some collection of bile on top of liver needs to be drained no other complaints except diarrohea    Past History lap choly ,history of anxiety   Past Med/Surgical Hx:  Anxiety:   Migraines:   wrist pain: chronic pain left wrist/hand  Depression:   Cholecystectomy:   Knee Surgery - Right:   Hernia Repair:   ALLERGIES:  No Known Allergies:     Other Allergies none   HOME MEDICATIONS: Medication Instructions Status  Zoloft 100 mg oral tablet 1 tab(s) orally 2 times a day at  8am 5 pm Active  Klonopin 0.5 mg oral tablet 1 tab(s) orally every 6 hours, As Needed- for Anxiety, Nervousness  Active  Imitrex 50 mg oral tablet 1 tab(s) orally once a day, As Needed- for Headache  Active  Protonix 40 mg oral delayed release tablet 1 tab(s) orally once a day in am Active  benztropine 1 mg oral tablet 1 tab(s) orally 2 times a day  Cogentin) Active  Percocet 5/325 oral tablet 1 tab(s) orally every 4 hours as needed for pain Active  Flomax 0.4 mg oral capsule 1 cap(s) orally once a day Active  Zocor 20 mg oral tablet 1 tab(s) orally once a day (at bedtime) Active   Family and Social History:   Family History Non-Contributory    Social History negative tobacco, negative ETOH    Place of Living Home   Review of Systems:   Fever/Chills No    Cough No    Sputum No    Abdominal Pain Yes  mild discomfor ruq    Diarrhea Yes    Constipation No    Nausea/Vomiting No    SOB/DOE No    Chest Pain No    Telemetry Reviewed Afib    Dysuria No    Tolerating PT No    Tolerating Diet No    Medications/Allergies Reviewed Medications/Allergies reviewed   Physical Exam:   VASCULAR ACCESS No bruit, no thrill    ABD positive tenderness  no hernia  soft  normal BS  no Abdominal Bruits    GU none    LYMPH negative  neck    EXTR negative edema    SKIN normal to palpation    NEURO normal    Additional Comments pt has collection of fluid on top of liver will need aspiration   Lab Results: Hepatic:  22-Aug-13 14:29    Bilirubin, Total 0.5   Alkaline Phosphatase  501   SGPT (ALT) 48   SGOT (AST) 31   Total Protein, Serum  8.5   Albumin, Serum 3.4  Routine Chem:  22-Aug-13 14:29    Glucose, Serum  121   BUN 10   Creatinine (comp) 1.18   Sodium, Serum 138   Potassium, Serum 4.0   Chloride, Serum 102   CO2, Serum 29   Calcium (Total), Serum 9.6   Osmolality (calc) 276   eGFR (African American) >60   eGFR (Non-African American) >60 (eGFR values <34m/min/1.73 m2 may be an indication of chronic kidney disease (CKD). Calculated eGFR is useful in patients with stable renal function. The eGFR calculation will not be reliable in acutely ill patients when serum creatinine is changing rapidly. It is not useful in  patients on dialysis. The eGFR calculation may not be applicable to patients at the low and  high extremes of body sizes, pregnant women, and vegetarians.)   Anion Gap 7   Lipase 218 (Result(s) reported on 10 Jun 2012 at 03:15PM.)  Routine UA:  22-Aug-13 14:29    Color (UA) Yellow   Clarity (UA) Hazy   Glucose (UA) Negative   Bilirubin (UA) Negative   Ketones (UA) Negative   Specific Gravity (UA) 1.012   Blood (UA) Negative   pH (UA) 7.0   Protein (UA) 30 mg/dL   Nitrite (UA) Negative   Leukocyte Esterase (UA) Negative (Result(s) reported on 10 Jun 2012 at 03:22PM.)   RBC (UA) 2 /HPF   WBC (UA) 2 /HPF   Bacteria (UA) NONE SEEN   Epithelial Cells (UA) <1 /HPF   Mucous (UA) PRESENT   Hyaline Cast (UA) 4 /LPF (Result(s) reported on 10 Jun 2012 at 03:22PM.)  Routine Coag:  23-Aug-13 06:00    Activated PTT (APTT) 32.6 (A HCT value >55% may artifactually increase the APTT. In one study, the increase was an average of 19%. Reference: "Effect on Routine and Special Coagulation  Testing Values of Citrate Anticoagulant Adjustment in Patients with High HCT Values." American Journal of Clinical Pathology 2006;126:400-405.)   Prothrombin 13.5   INR 1.0 (INR reference interval applies to patients on anticoagulant therapy. A single INR therapeutic range for coumarins is not optimal for all indications; however, the suggested range for most indications is 2.0 - 3.0. Exceptions to the INR Reference Range may include: Prosthetic heart valves, acute myocardial infarction, prevention of myocardial infarction, and combinations of aspirin and anticoagulant. The need for a higher or lower target INR must be assessed individually. Reference: The Pharmacology and Management of the Vitamin K  antagonists: the seventh ACCP Conference on Antithrombotic and Thrombolytic Therapy. GDJME.2683 Sept:126 (3suppl): [id number]. A HCT value >55% may artifactually increase the PT.  In one study,  the increase was an average of 25%. Reference:  "Effect on Routine and Special Coagulation Testing Values of Citrate Anticoagulant Adjustment in Patients with High HCT Values." American Journal of Clinical Pathology 2006;126:400-405.)  Routine Hem:  22-Aug-13 14:29    WBC (CBC) 10.1   RBC (CBC) 4.57   Hemoglobin (CBC) 13.2   Hematocrit (CBC)  39.7   Platelet Count (CBC)  505 (Result(s) reported on 10 Jun 2012 at 03:08PM.)   MCV 87   MCH 29.0   MCHC 33.4   RDW 14.1   Radiology Results: [place]:    22-Aug-13 19:08, US Abdomen Limited Survey   US Abdomen Limited Survey   REASON FOR EXAM:    s/p complicated cholecystectomy, very elevated alk   phos  COMMENTS:   Body Site: GB and Fossa, CBD, Head of Pancreas    PROCEDURE: [place]  - US ABDOMEN LIMITED SURVEY  - Jun 10 2012  7:08PM     RESULT: Comparison: None    Technique: Multiple gray-scale and color-flow Doppler images of the right   upper quadrant are presented for review.    Findings:    Visualized portions of the liver demonstrate normal  echogenicity and   normal contours. The liver is without evidence of a focal hepatic lesion.   There is no intra- or extrahepatic biliary ductal dilatation. The common   duct measures 4.2 mm in maximal diameter. Patient is status post   cholecystectomy. There is perihepatic free fluid with a few internal   septations.    The visualized portion of the pancreas is normal in echogenicity.    IMPRESSION:     Prior  cholecystectomy. There is perihepatic free fluid which is   nonspecific and may represent simple ascites versus mild versus abscess.   Further evaluation with a CT of the abdomen and possible HIDA scan is   recommended.    Dictation Site: 1    Verified By: Jennette Banker, M.D., MD  CT:    22-Aug-13 20:57, CT Abdomen and Pelvis With Contrast   CT Abdomen and Pelvis With Contrast   REASON FOR EXAM:    (1) assess perihepatic fluid; (2) assess perihepatic   fluid  COMMENTS:       PROCEDURE: CT  - CT ABDOMEN / PELVIS  W  - Jun 10 2012  8:57PM     RESULT: History: Perihepatic fluid    Comparison:  05/21/2012    Technique: Multiple axial images of the abdomen and pelvis were performed   from the lung bases to the pubic symphysis, without p.o. contrast and   with 85 ml of Isovue 300 intravenous contrast.    Findings:  The lung bases are clear. There is no pneumothorax. The heart size is   normal.     The liver demonstrates no focal abnormality. There is no intrahepatic or   extrahepatic biliary ductal dilatation. The gallbladder is surgically   absent. There is perihepatic fluid located anterolaterally deforming the   liver capsule suggesting that the fluid is loculate, measuring 10.5 x 1.9   cm. The spleen demonstrates no focal abnormality. The kidneys, adrenal   glands, and pancreas are normal. The bladder is unremarkable.     The stomach, duodenum, small intestine, and large intestine demonstrate   no gross abnormality. There is a normal caliber appendix in the right    lower quadrant without periappendiceal inflammatory changes. There is   diverticulosis without evidence of diverticulitis. There is no   pneumoperitoneum, pneumatosis, or portal venous gas. There is no     abdominal or pelvic free fluid. There is no lymphadenopathy.     The abdominal aorta is normal in caliber with atherosclerosis.    The osseous structures are unremarkable.    IMPRESSION:     1. There is perihepatic fluid located anterolaterally deforming the liver   capsule suggesting that the fluid is loculated, measuring 10.5 x 1.9 cm.   This may represent a loculated biloma versus free fluid versus an   abscess. There is no abdominal free fluid.    Dictation Site: 1      Verified By: Jennette Banker, M.D., MD     Plan Ct guided aspiration   Electronic Signatures: Vella Kohler (MD)  (Signed 23-Aug-13 08:40)  Authored: CHIEF COMPLAINT and HISTORY, PAST MEDICAL/SURGIAL HISTORY, ALLERGIES, Other Allergies, HOME MEDICATIONS, FAMILY AND SOCIAL HISTORY, REVIEW OF SYSTEMS, PHYSICAL EXAM, LABS, Radiology, ASSESSMENT AND PLAN   Last Updated: 23-Aug-13 08:40 by Vella Kohler (MD)

## 2015-02-06 NOTE — Consult Note (Signed)
Chief Complaint:   Subjective/Chief Complaint patient feeling some better today.  mild ruq pain.  Some nausea this am, not now.  passing flatus, no bm overnight.   VITAL SIGNS/ANCILLARY NOTES: **Vital Signs.:   03-Aug-13 06:06   Vital Signs Type Routine   Temperature Temperature (F) 98   Celsius 36.6   Temperature Source Oral   Pulse Pulse 74   Respirations Respirations 16   Systolic BP Systolic BP 009   Diastolic BP (mmHg) Diastolic BP (mmHg) 74   Mean BP 86   Pulse Ox % Pulse Ox % 95   Pulse Ox Activity Level  At rest   Oxygen Delivery Room Air/ 21 %   Brief Assessment:   Cardiac Regular    Respiratory clear BS    Gastrointestinal details normal Bowel sounds normal  No rebound tenderness  mild ruq firmness/distension c/w recent surgery.   no rebound, no pain left abdomen.   Lab Results: Hepatic:  02-Aug-13 04:16    Bilirubin, Total 0.4   Alkaline Phosphatase 92   SGPT (ALT) 19 (12-78 NOTE: NEW REFERENCE RANGE 09/12/2011)   SGOT (AST) 25   Total Protein, Serum 7.5   Albumin, Serum 3.9  Routine Chem:  02-Aug-13 04:16    Glucose, Serum  146   BUN 13   Creatinine (comp)  1.32   Sodium, Serum 141   Potassium, Serum 4.0   Chloride, Serum 103   CO2, Serum 28   Calcium (Total), Serum 8.9   Osmolality (calc) 284   eGFR (African American) >60   eGFR (Non-African American)  57 (eGFR values <49m/min/1.73 m2 may be an indication of chronic kidney disease (CKD). Calculated eGFR is useful in patients with stable renal function. The eGFR calculation will not be reliable in acutely ill patients when serum creatinine is changing rapidly. It is not useful in  patients on dialysis. The eGFR calculation may not be applicable to patients at the low and high extremes of body sizes, pregnant women, and vegetarians.)   Anion Gap 10   Lipase 102 (Result(s) reported on 21 May 2012 at 04:36AM.)  Routine UA:  02-Aug-13 05:01    Color (UA) Yellow   Clarity (UA) Clear   Glucose  (UA) Negative   Bilirubin (UA) Negative   Ketones (UA) Negative   Specific Gravity (UA) 1.014   Blood (UA) Negative   pH (UA) 6.0   Protein (UA) Negative   Nitrite (UA) Negative   Leukocyte Esterase (UA) Negative (Result(s) reported on 21 May 2012 at 06:58AM.)   RBC (UA) <1 /HPF   WBC (UA) 1 /HPF   Bacteria (UA) NONE SEEN   Epithelial Cells (UA) NONE SEEN   Mucous (UA) PRESENT (Result(s) reported on 21 May 2012 at 06:58AM.)  Routine Hem:  02-Aug-13 04:16    WBC (CBC)  15.5   RBC (CBC) 4.77   Hemoglobin (CBC) 14.2   Hematocrit (CBC) 41.4   Platelet Count (CBC) 218 (Result(s) reported on 21 May 2012 at 04:31AM.)   MCV 87   MCH 29.8   MCHC 34.4   RDW 14.4   Assessment/Plan:  Assessment/Plan:   Assessment 1) post lccy bile duct leak.  some symptomatic improvement overnight.    Plan 1) case discussed with Dr BBary Castilla  Apparently there are plans for ercp tody with Dr IDionne Milo  Will follow over the weekend.   Electronic Signatures: SLoistine Simas(MD)  (Signed 03-Aug-13 14:06)  Authored: Chief Complaint, VITAL SIGNS/ANCILLARY NOTES, Brief Assessment, Lab Results, Assessment/Plan  Last Updated: 03-Aug-13 14:06 by Loistine Simas (MD)

## 2015-02-06 NOTE — Consult Note (Signed)
PATIENT NAME:  Omar Foster, Omar Foster MR#:  [id number] DATE OF BIRTH:  [birth year]  DATE OF CONSULTATION:  05/22/2012  REFERRING PHYSICIAN:  Dr. Cecelia ByarsHashmi  CONSULTING PHYSICIAN:  Christena DeemMartin U. Macklin Jacquin, MD  REASON FOR CONSULTATION: Possible bile duct leak.   HISTORY OF PRESENT ILLNESS: Mr. Omar Foster is a 64 year old Caucasian male who underwent laparoscopic cholecystectomy about three days ago. He stated that about a month ago he was having some abdominal discomfort, underwent an ultrasound at his primary physician's office with the diagnosis of gallstones. Subsequently, he was sent to see Dr. Cecelia ByarsHashmi. He initially did well with his cholecystectomy. He went home then awakened later that night or perhaps the next night, he is uncertain, with a stabbing pain in the right upper quadrant. This worsened over the next 24 hours and he came to the Emergency Room on Friday. He initially had problems with shortness of breath, although that is somewhat improved currently. He has been having right upper quadrant pain. There is nausea but no vomiting. He has no history of peptic ulcer disease in the past. No history of colonoscopy in the past. He has had one bowel movement and is passing flatus well since his surgery.   PAST MEDICAL HISTORY:  1. History of depression with ECT.  2. History of left inguinal herniorrhaphy. 3. Right knee surgery.  4. He denies any problems with hypertension, diabetes, thyroid problems, liver or kidney problems, ulcers, or hepatitis. He has no heart or lung problems.   GI FAMILY HISTORY: Negative for colorectal cancer, liver disease or ulcers.   SOCIAL HISTORY: He does not use tobacco products. He does not drink alcohol. He does not use recreational drugs.   PHYSICAL EXAMINATION:  VITAL SIGNS: Temperature 97.8, pulse 87, respirations 18, blood pressure 123/85, pulse oximetry 93%.   GENERAL: He is a 64 year old Caucasian male no acute distress, mild discomfort.   HEENT: Normocephalic,  atraumatic. Eyes are anicteric. Nose: Septum midline. No lesions. Oropharynx: No lesions.   NECK: Supple. No JVD.   HEART: Regular rate and rhythm.   LUNGS: Clear.   ABDOMEN: Soft. He is tender to palpation in the area of his surgical incisions. There is a point tenderness in this area as well. There are no masses or rebound. Bowel sounds positive, normoactive.   RECTAL: Anorectal exam deferred.   EXTREMITIES: No clubbing, cyanosis, edema.   NEUROLOGICAL: Cranial nerves II through XII grossly intact. Muscle strength bilaterally equal and symmetric, 5/5. Deep tendon reflexes bilaterally equal and symmetric.   LABORATORY, DIAGNOSTIC, AND RADIOLOGICAL DATA: On admission to the hospital he had glucose 146, BUN 13, creatinine 1.32, sodium 141, potassium 4.0, chloride 103, bicarbonate 28, calcium 8.9, lipase 102. His hepatic profile was normal. He had a white count 15.5, hemoglobin and hematocrit 14.2/41.4, platelet count 218. Differential was not done. MCV 87. Urinalysis was normal. He had a portable chest film due to his shortness of breath showing no acute cardiopulmonary disease. He had a hepatobiliary scan showing findings concerning for a bile leak extending into the subhepatic region and the right pericolic gutter. No actual biloma noted. He had a CT scan of the abdomen and pelvis with contrast showing prior cholecystectomy. No fluid in the gallbladder fossa. Mild biliary distention without evidence of obstructing lesion felt to be probably postoperative. He has multiple small splenic lesions, infectious versus malignant process involving spleen could not be excluded.   ASSESSMENT: Acute onset right upper quadrant pain and shortness of breath, status post laparoscopic cholecystectomy. Scanning consistent with possible  bile duct leak, cystic stump versus ducts of Luschka. No overt biloma.   RECOMMENDATION: Patient is stable currently and he is afebrile. He is currently on an antibiotic. We do not  have availability of ERCP this evening and may not have so this weekend. Would recommend continuing current with close observation. At times if this is a small ductal leak these may seal themselves as local edema decreases. If there is continued symptoms or no improvement he will need to have an ERCP with placement of a biliary stent to increase off load. Case discussed with Dr. Lemar Livings.   ____________________________ Christena Deem, MD mus:cms D: 05/22/2012 14:01:31 ET T: 05/22/2012 14:42:36 ET JOB#: [id number]  cc: Christena Deem, MD, <Dictator> Christena Deem MD ELECTRONICALLY SIGNED 06/09/2012 21:14

## 2015-02-06 NOTE — H&P (Signed)
Subjective/Chief Complaint abdominal pain    History of Present Illness patient said he had abdominal painwhich started early in the morning around the belly button area and right side of abdomen .His lap choly was uneventfull except i had hard time filling the upper duct as contrast was filling the lower duct.I used Psychologist, occupational and needle was put in [employer] with good visulisation of the cystic duct. His liver functions are normal Ct shows minimal fluid above the liver    Past History pt had electroconvulsive treatments   Past Med/Surgical Hx:  Anxiety:   Migraines:   wrist pain: chronic pain left wrist/hand  Depression:   Cholecystectomy:   Knee Surgery - Right:   Hernia Repair:   ALLERGIES:  No Known Allergies:   HOME MEDICATIONS: Medication Instructions Status  Zoloft 100 mg oral tablet 1 tab(s) orally 2 times a day at  8am 5 pm Active  Klonopin 0.5 mg oral tablet 1 tab(s) orally every 6 hours, As Needed- for Anxiety, Nervousness  Active  Mobic 7.5 mg oral tablet 1 tab(s) orally 2 times a day Active  Imitrex 50 mg oral tablet 1 tab(s) orally once a day, As Needed- for Headache  Active  Abilify 10 mg oral tablet 1 tab(s) orally once a day in am Active  Flomax 0.4 mg oral capsule 1 cap(s) orally once a day in am Active  Proscar 5 mg oral tablet 1 tab(s) orally once a day in am Active  Protonix 40 mg oral delayed release tablet 1 tab(s) orally once a day in am Active  ultram 90m 1 tab oral prn Active  Zocor 270m1 tab(s) orally once a day (in the morning) Active  Percocet 5/325 oral tablet tab(s) orally may take 2 every 4 hourws as needed for pain Active   Family and Social History:   Family History Non-Contributory    Social History negative tobacco, negative ETOH    Place of Living group home   Review of Systems:   Subjective/Chief Complaint abdominal pain    Fever/Chills No    Cough No    Sputum No    Abdominal Pain Yes    Diarrhea No     Constipation No    Nausea/Vomiting No    SOB/DOE No    Chest Pain No    Telemetry Reviewed Afib    Dysuria No    Tolerating PT No   Physical Exam:   CARD no JVD    VASCULAR ACCESS No bruit, no thrill    ABD positive tenderness  denies Flank Tenderness  no liver/spleen enlargement  no hernia  soft  normal BS  passing flatus    NEURO negative tremor    PSYCH alert   Lab Results: Hepatic:  02-Aug-13 04:16    Bilirubin, Total 0.4   Alkaline Phosphatase 92   SGPT (ALT) 19 (12-78 NOTE: NEW REFERENCE RANGE 09/12/2011)   SGOT (AST) 25   Total Protein, Serum 7.5   Albumin, Serum 3.9  Routine Chem:  02-Aug-13 04:16    Glucose, Serum  146   BUN 13   Creatinine (comp)  1.32   Sodium, Serum 141   Potassium, Serum 4.0   Chloride, Serum 103   CO2, Serum 28   Calcium (Total), Serum 8.9   Osmolality (calc) 284   eGFR (African American) >60   eGFR (Non-African American)  57 (eGFR values <60103min/1.73 m2 may be an indication of chronic kidney disease (CKD). Calculated eGFR is useful in  patients with stable renal function. The eGFR calculation will not be reliable in acutely ill patients when serum creatinine is changing rapidly. It is not useful in  patients on dialysis. The eGFR calculation may not be applicable to patients at the low and high extremes of body sizes, pregnant women, and vegetarians.)   Anion Gap 10   Lipase 102 (Result(s) reported on 21 May 2012 at 04:36AM.)  Routine UA:  02-Aug-13 05:01    Color (UA) Yellow   Clarity (UA) Clear   Glucose (UA) Negative   Bilirubin (UA) Negative   Ketones (UA) Negative   Specific Gravity (UA) 1.014   Blood (UA) Negative   pH (UA) 6.0   Protein (UA) Negative   Nitrite (UA) Negative   Leukocyte Esterase (UA) Negative (Result(s) reported on 21 May 2012 at 06:58AM.)   RBC (UA) <1 /HPF   Bacteria (UA) NONE SEEN   Epithelial Cells (UA) NONE SEEN   Mucous (UA) PRESENT (Result(s) reported on 21 May 2012 at 06:58AM.)   Routine Hem:  02-Aug-13 04:16    WBC (CBC)  15.5   RBC (CBC) 4.77   Hemoglobin (CBC) 14.2   Hematocrit (CBC) 41.4   Platelet Count (CBC) 218 (Result(s) reported on 21 May 2012 at 04:31AM.)   MCV 87   MCH 29.8   MCHC 34.4   RDW 14.4     Assessment/Admission Diagnosis i do not know the reason for his pain i do not think there is possibility of CBD injury as i was close to gall bladder although that is a  consideration Possibilty of stone blocking the common bile duct above the cystic duct is possibilty or sometimes with the flow directing down the upper part of duct is not visible    Plan will give antibiotics ,iv fluids and hida scan possible MRCP. i checked the wound which is ok  Dr Fay Records will be covering me this week end   Electronic Signatures: Vella Kohler (MD)  (Signed 02-Aug-13 08:51)  Authored: CHIEF COMPLAINT and HISTORY, PAST MEDICAL/SURGIAL HISTORY, ALLERGIES, HOME MEDICATIONS, FAMILY AND SOCIAL HISTORY, REVIEW OF SYSTEMS, PHYSICAL EXAM, LABS, ASSESSMENT AND PLAN   Last Updated: 02-Aug-13 08:51 by Vella Kohler (MD)

## 2015-02-06 NOTE — Consult Note (Signed)
Brief Consult Note: Diagnosis: bile duct leak s/p LCCY.   Patient was seen by consultant.   Recommend to proceed with surgery or procedure.   Recommend further assessment or treatment.   Discussed with Attending MD.   Comments: Patietn seen and examined, full consult to follow.  Patient admitted with abdominal pain and sob 2 days after lccy, uncomplicated.  Imaging studies today indicate likely small bile leak.  Patient feeling some better at present.  Continue current.  Small leak may resolve spontaneously.  If patient continues with sx, may need ERCP with cbd stent.  [facility] not [facility] until monday.  Should patient worsen, would need transfer to institution with that availability.  Following.  Electronic Signatures: Barnetta ChapelSkulskie, Delando Satter (MD)  (Signed 03-Aug-13 00:01)  Authored: Brief Consult Note   Last Updated: 03-Aug-13 00:01 by Barnetta ChapelSkulskie, Eliakim Tendler (MD)

## 2015-02-06 NOTE — Consult Note (Signed)
Brief Consult Note: Comments: Psychiatry: Not a formal consult, but patient had been scheduled for maintainance ECT treatment today and then I learned he was in the hospital. Depression a little worse than at baseline but no SI or acute danger. I suggest we reschedule ECT for next Wednesday, August 28. 2013. Patient agrees. I'll update ECT nursing staff who will follow up with his home to arange treatment.  Electronic Signatures: Audery Amellapacs, John T (MD)  (Signed 23-Aug-13 10:56)  Authored: Brief Consult Note   Last Updated: 23-Aug-13 10:56 by Audery Amellapacs, John T (MD)

## 2015-02-06 NOTE — Consult Note (Signed)
Chief Complaint:   Subjective/Chief Complaint abdominal pain   VITAL SIGNS/ANCILLARY NOTES: **Vital Signs.:   05-Aug-13 05:48   Vital Signs Type Routine   Temperature Temperature (F) 98.2   Celsius 36.7   Temperature Source Oral   Pulse Pulse 56   Respirations Respirations 20   Systolic BP Systolic BP 126   Diastolic BP (mmHg) Diastolic BP (mmHg) 77   Mean BP 93   Pulse Ox % Pulse Ox % 96   Pulse Ox Activity Level  At rest   Oxygen Delivery Room Air/ 21 %   Brief Assessment:   Cardiac Regular    Respiratory normal resp effort    Gastrointestinal Normal    Gastrointestinal details normal Soft    Additional Physical Exam pt abdomen much better today the leak in the liver has stopped.started on diet today   Assessment/Plan:  Assessment/Plan:   Plan will watch him another day and then discharge after he starts feeling better   Electronic Signatures: Jovita GammaHashmi, Mackay Hanauer (MD)  (Signed 05-Aug-13 17:17)  Authored: Chief Complaint, VITAL SIGNS/ANCILLARY NOTES, Brief Assessment, Assessment/Plan   Last Updated: 05-Aug-13 17:17 by Jovita GammaHashmi, Robertine Kipper (MD)

## 2015-02-06 NOTE — Consult Note (Signed)
Chief Complaint:   Subjective/Chief Complaint Patient was seen on the request of Dr. Cecelia ByarsHashmi due to suspected bile leak. ERCP was explained in detail. He agreed to the procedure. ERCP was attempted. A swollen ampulla was seen with no orifice visible. Extensive efforts to locate and cannulate the orifice failed. Procedure was aborted.  Recommeendations: Repeat HIDA on Monday. If remains abnormal please consult Dr. Bluford Kaufmannh for re-attempt at ERCP. Discussed with Dr. Electa SniffBarnett. Thanks.   Electronic Signatures: Lurline DelIftikhar, Abdimalik Mayorquin (MD)  (Signed 03-Aug-13 16:01)  Authored: Chief Complaint   Last Updated: 03-Aug-13 16:01 by Lurline DelIftikhar, Racquelle Hyser (MD)

## 2015-02-11 NOTE — H&P (Signed)
PATIENT NAME:  Omar Foster, Omar Foster MR#:  [id number] DATE OF BIRTH:  [birth year]  DATE OF ADMISSION:  12/17/2011  IDENTIFYING INFORMATION AND CHIEF COMPLAINT: This is a 64 year old man who was recently discharged from the psychiatry ward, was brought back by the friends with whom he lives.   CHIEF COMPLAINT: "I don't know what happened".  HISTORY OF PRESENT ILLNESS: Information obtained from the patient and from the chart and nursing interview with the patient's friends. The patient is also well known to me from his recent hospitalization. He was just discharged from the hospital the day prior to admission. He had suffered with major depression and obsessive anxiety which was finally treated with electroconvulsive therapy. He has been undergoing ECT and had had several treatments on a regular basis. The treatments had greatly improved his mood and anxiety symptoms but he had suffered from cognitive problems including memory loss and some degree of delirium. His last ECT treatment was done on Monday, February 25th. At that time he had a right unilateral treatment which he appeared to tolerate well. He had some confusion the afternoon of the treatment but appeared to be better by Tuesday. On Tuesday he went home with the plan to continue medication and follow-up with outpatient treatment a week from today. Instead the patient went home and is reported to have shown signs of confusion that very afternoon. His friends report that he was already asking whether he was in [place] and appeared to be disoriented. Last night he evidently awoke in the middle of the night, walked outside of the house, and fell asleep in the woods. The friends with whom he lived woke up and found the door open and the patient missing. He was eventually found sleeping in the woods. The patient tells me today that he does not remember any of this. He remembers going to sleep last night and feeling like he had a dream but not remembering anything  until he woke up outdoors. On interview this morning, the patient states that his mood is currently feeling good. Denies sense of depression. He denies any suicidal or homicidal ideation. He denies having current obsessive thoughts and denies feeling particularly anxious. He had been on his medication as normally prescribed. He denies that he took any other or excess medication. On interview this morning, he was alert and oriented x4. He knew where he was, knew who I was, was able to tell me about his ECT treatment and describe the recent events. He did not show any signs of delirium currently at all.   PAST PSYCHIATRIC HISTORY: Mr. Dolley was in the hospital for over a month for this last admission. He was treated first with medications including antidepressants and antipsychotics with only minimal improvement. He eventually was referred for ECT treatment and agreed to a series of treatments. He did show significant improvement in his mood and anxiety but had problems with delirium and memory loss. Previously he has had other psychiatric hospitalizations. Has been on multiple antidepressants and other medicines. States that he has a long history of obsessive worry, anxiety, and intrusive suicidal ideation.   SUBSTANCE ABUSE HISTORY: The patient has admitted that he has a past history of substance abuse but recently has not been drinking or abusing substances. There is no evidence that he had been abusing drugs after leaving the hospital yesterday.   PAST MEDICAL HISTORY: The patient is in pretty good health. No significant ongoing medical problems other than the psychiatric.   SOCIAL HISTORY: He  is currently living with two friends here in the [place] area. He does not have family with whom he stays regularly in touch. 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 His friends appear to be genuinely supportive of him and have stood by him  and been helpful as he has dealt with his mental illness.   REVIEW OF SYSTEMS: On interview today he denies depressed mood, denies fatigue, denies auditory or visual hallucinations, denies suicidal or homicidal ideation. Does not feel anxious, does not have panic attacks. No physical complaints. Otherwise, noncontributory.   MENTAL STATUS EXAMINATION: Neatly groomed man interviewed in the Emergency Room. Good eye contact. Normal psychomotor activity. Speech normal rate, tone, and volume. Affect euthymic and reactive. Mood stated as being good. Thoughts are lucid and directed. Calm. No sign of delusional thinking or loosening of associations. Denies auditory or visual hallucinations. He was alert and oriented completely to place, date, time, and situation. Short-term memory intact to immediate testing. Judgment and insight appear good.   PHYSICAL EXAMINATION:   GENERAL: The patient does not appear to be in any physical distress.   VITAL SIGNS: Temperature 97.9, pulse 86, respirations 18, blood pressure 119/86.   SKIN: No acute skin lesions.   HEENT: Pupils equal and reactive. Face symmetric. Mucosa normal. Dentition normal.   NEUROLOGIC: Normal cranial nerves completely intact and symmetric.   MUSCULOSKELETAL: Strength is symmetric as are reflexes and are normal throughout. Full range of motion at all extremities and normal gait. Neck flexible.   BACK: Nontender.   HEART: Regular rate and rhythm. No extra sounds.   LUNGS: Clear with no wheezing.   ABDOMEN: Soft, nontender. Normal bowel sounds.   ASSESSMENT: The patient is with a history of severe major depression and excessive anxiety, possibly obsessive compulsive disorder, who has recently shown improvement with ECT. He appeared to be doing well at the time of discharge but last night had an episode that sounds mostly like sleepwalking. He is reported to have had a little bit of confusion yesterday. Currently he is not showing any signs  of delirium or confusion. I had considered discharging him from the Emergency Room back home but his friends do not want him returning home until they have some further reassurance that his behavior is safe.   TREATMENT PLAN:  1. Admit to Psychiatry.  2. Continue current medications. Those include Abilify 10 mg per day, Zoloft 100 mg twice a day, clonazepam one-half mg q.6 hours p.r.n. for anxiety, and p.r.n. Cogentin.  3. Engage the patient in normal groups and activities on the unit.  4. Observe and re-evaluate for any signs of delirium or confusion.   LABORATORY DATA: Labs were done in the Emergency Room today. Drug screen is all negative. TSH normal. Alcohol nondetectable. Chemistry all normal except for a very slightly elevated glucose of no significance. CBC shows a slightly elevated white count at 13.1. Urinalysis unremarkable.   DIAGNOSES PRINCIPLE AND PRIMARY:  AXIS I: Major depression, severe, recurrent.   SECONDARY DIAGNOSES:  AXIS I:  1. Obsessive compulsive disorder.  2. Delirium secondary to recent medical treatment (electroconvulsive therapy).  AXIS II: Deferred.   AXIS III: No diagnosis.   AXIS IV: Moderate to severe stress from lack of family support.   AXIS V: Functioning at time of evaluation 53.   ____________________________ Audery AmelJohn T. Mickelle Goupil, MD jtc:drc D: 12/17/2011 16:17:58 ET T: 12/17/2011 16:37:45 ET JOB#: [id number]  cc:  Audery Amel, MD, <Dictator> Audery Amel MD ELECTRONICALLY SIGNED 12/17/2011 22:15

## 2015-02-11 NOTE — Discharge Summary (Signed)
PATIENT NAME:  Omar Foster, Omar Foster MR#:  [id number] DATE OF BIRTH:  06-11-1951  DATE OF ADMISSION:  11/09/2011 DATE OF DISCHARGE:  12/16/2011  HOSPITAL COURSE: See dictated history and physical. The patient is a 64 year old man with a long history of severe depression and anxiety symptoms who was admitted to the hospital with suicidal ideation, severe depression, and inability to function outside the hospital. The patient was cooperative with treatment. He was initially on Dr. Providence CrosbyWilliford's service and Dr. Jeanie SewerWilliford treated him aggressively with antidepressants and Abilify. The patient showed minimal to no improvement. He did go to groups appropriately and participated and appeared to be motivated to improve but continued to have intrusive obsessive ideas about suicide and homicide as well as depressed hopeless mood and anxiety. The patient was referred for consideration of ECT. I saw the patient <<MISSING TEXT>> (Dictation Cut Off Here)  ____________________________ Audery AmelJohn T. Clapacs, MD jtc:drc D: 12/26/2011 11:31:14 ET T: 12/26/2011 11:35:51 ET JOB#: [id number]  cc: Audery AmelJohn T. Clapacs, MD, <Dictator>
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
## 2015-02-11 NOTE — Discharge Summary (Signed)
PATIENT NAME:  Omar Foster, Klein P MR#:  [id number] DATE OF BIRTH:  February 01, 1951  DATE OF ADMISSION:  12/17/2011 DATE OF DISCHARGE:  12/26/2011  HOSPITAL COURSE: See dictated history and physical. This 64 year old gentleman with major depression who had recently been discharged from the hospital was brought back by his friends because he exhibited confusion and was wandering away from home. They felt unsafe with him continuing to reside with them. The patient was continuing to show some signs of delirium due to his ECT treatment which were intermittent. In the hospital, he did not engage in any dangerous or aggressive behavior. He had some transient occasional lapses of memory and a couple of episodes of confusion at night time, but overall, no major confusion and he improved significantly during his hospital stay. His mood remained good. He had no return of depression. No return of any suicidal ideation or obsessive worry. He was treated with the same medicine that he had been on previously. It transpired during this admission that his friends were no longer comfortable with his living with them. We have now found an acceptable group home for the patient and he will be discharged there today. Additionally, we discussed ECT treatment. His robust and unique response to ECT suggests that maintenance ECT would potentially be extremely helpful for him. On the other hand, his dramatic development of delirium would be a contraindication. After discussion, we agreed to a trial of one maintenance treatment to be done on 03/06 with evaluation of confusion afterwards. He had right unilateral ECT treatment on 03/06 and tolerated it well. The only confusion he has had is a very brief episode of disorientation during the nighttime which quickly resolved. No behavior problems. No significant daytime memory or confusion problems. Based on this, I recommend that we try going to a once every two weeks schedule for maintenance ECT  and the patient is agreeable. We will arrange to have him come back for follow-up treatment next on 03/20.   LABORATORY, DIAGNOSTIC AND RADIOLOGICAL DATA: For this admission, labs showed negative drug screen, thyroid stimulating hormone normal, alcohol negative, chemistry panel all normal, CBC normal except for a slightly elevated white count at 13.1. Urinalysis unremarkable.   DISCHARGE MEDICATIONS:  1. Abilify 10 mg per day.  2. Zoloft 100 mg twice a day. 3. Klonopin 0.5 mg every six hours p.r.n. for anxiety. 4. Flomax 0.4 mg once a day.   MENTAL STATUS EXAM: Neatly dressed and groomed. Cooperative. Good eye contact. Normal speech, rate and tone. Affect euthymic, reactive and appropriate. Mood stated as good. Thoughts lucid and directed. No sign of confusion or disorientation or loosening of associations. Denies hallucinations. Denies any suicidal or homicidal ideation. Understands discussion of treatment plans. Good judgment and insight.   DIAGNOSIS PRINCIPLE AND PRIMARY:   AXIS I: Major depressive episode, severe, recurrent, currently much improved.   SECONDARY DIAGNOSES:   AXIS I:  1. Obsessive-compulsive disorder, much improved.  2. Delirium secondary to ECT, resolved.    AXIS II: No diagnosis.   AXIS III: Enlarged prostate.   AXIS IV: Moderate to severe stress from social dislocation.   AXIS V: Functioning at time of discharge 60.   ____________________________ Audery AmelJohn T. Clapacs, MD jtc:ap D: 12/26/2011 11:26:03 ET T: 12/26/2011 11:42:50 ET JOB#: [id number]  cc: Audery AmelJohn T. Clapacs, MD, <Dictator> Audery AmelJOHN T CLAPACS MD ELECTRONICALLY SIGNED 12/26/2011 14:05

## 2015-02-11 NOTE — Discharge Summary (Signed)
PATIENT NAME:  Omar Foster, Omar Foster MR#:  [id number] DATE OF BIRTH:  June 18, 1951  DATE OF ADMISSION:  11/09/2011 DATE OF DISCHARGE:  11/27/2011  IDENTIFYING INFORMATION AND REASON FOR TRANSFER: This is a 64 year old gentleman with severe, recurrent major depression and anxiety disorder who is being transferred to my service for elective treatment with electroconvulsive therapy.   HISTORY OF PRESENT ILLNESS: See the previous physicians' notes for history, but in brief this is a 64 year old gentleman who presented to the Emergency Room on 01/20 with symptoms of severely depressed mood, anxiety, suicidal ideation, and a feeling of paranoia. He was not at that time receiving psychiatric treatment. He was having intrusive thoughts about hanging himself and also intrusive thoughts about doing something violent to other people. Since being in the hospital, he has been put on Zoloft 200 mg a day and Abilify 10 mg a day as well as clonazepam, which are medications he felt in the past had been effective for him. He feels like he has had only partial improvement. He continues to feel like he is extremely depressed most of the day. Feels sad and hopeless. He has intrusive thoughts still about killing himself on a regular basis. He has intrusive homicidal ideation, which he does not wish to act on. Feels nervous a lot of the time. He is tolerating his medication well. He thinks the clonazepam he is currently being prescribed has helped him as far as anxiety but he remains very depressed. The patient was seen over the weekend for an ECT consultation. At that time I was ambivalent about the suitability of ECT for his treatment. Since that time I have discussed the case with Dr. Rhona Raider who is the current psychiatrist and I have met again with the patient. It appears that the depressive symptoms have continued and are more severe than the anxiety at this point. The patient is definitely in favor of getting ECT. Dr. Rhona Raider  is in favor of ECT. At this point it seems like a reasonable treatment given his depression.   PAST PSYCHIATRIC HISTORY: Multiple episodes of depression and anxiety going back years. It sounds like major depression as well as obsessive-compulsive symptoms. He has had partial response to medications in the past but says he has never had complete response. He has had multiple psychiatric admissions. He has had more than one serious suicide attempt with gestures towards hanging himself that were interrupted by his then wife.   PAST MEDICAL HISTORY: The patient has a history of some orthopedic injuries in the past but does not have significant ongoing active medical problems. Does not have high blood pressure. No history of heart disease. No history of stroke. No history of closed head injury or surgery on his head or neurologic disease outside of psychiatric.   REVIEW OF SYSTEMS: Reports his mood feels depressed. He feels tired much of the time. He has intrusive thoughts about killing himself. He has intrusive homicidal ideation.  He feels anxious about this. Feels nervous, especially when he is by himself. He has some difficulty sleeping at night unless he is taking sedating medicine. Appetite is adequate. GI complaints are negative. No acute pain at this time, no cardiac complaints. Otherwise noncontributory.   MENTAL STATUS EXAM: Neatly dressed and groomed. Cooperative. Good eye contact. Very restricted psychomotor activity and affect. Speech normal rate and tone. Mood stated as still depressed. Thoughts are lucid and directed for the most part. No evidence of loosening of associations or delusional thinking. Denies hallucinations. He says  he does still have intrusive thoughts of killing himself, but does not think he is going to act on it in the hospital. He cannot feels safe about whether he would act on it outside the hospital. Denies any homicidal intent but has intrusive homicidal ideation. Judgment and  insight currently seem okay. He seems reasonable to understand risks and benefits of medical treatment and make appropriate informed decisions.   CURRENT MEDICATIONS:   1. Aripiprazole 10 mg at night. 2. Cogentin 1 mg b.i.d.  3. Zoloft 100 mg twice a day.  4. Clonazepam 2 mg at night as needed for sleep and 0.5 mg q. 6 hours as needed for anxiety.   ALLERGIES: No known drug allergies.   LABORATORY RESULTS: I repeated labs already today. Chemistry panel only shows a slightly elevated glucose at 110, of no significance given that it was not a fasting test. There is no significant abnormality on his CBC.   ASSESSMENT: This is a 64 year old man with major depression, severe, recurrent, also obsessive-compulsive disorder. Has been in the hospital now for going on three weeks. No response so far or minimal response to medication. History of only partial response to medication in the past including combined regimens. Never had ECT. No specific contraindications to ECT. The patient has been offered the opportunity to ask appropriate questions. He has received information about the risks and benefits of ECT. He is at this point verbally agreeable to starting treatment tomorrow.   TREATMENT PLAN: The ECT staff nurse will meet with the patient and go over specific paperwork including informed consent and see if he will sign. Laboratory tests have been reordered. Chest x-ray and EKG reordered. Anesthesia consult reordered. At this point we will plan to begin ECT tomorrow. I will transfer patient to my service. I will not change any of his medications acutely at this point. The clonazepam may be an issue but we will see how he does.   DIAGNOSIS PRINCIPLE AND PRIMARY:  AXIS I: Major depressive episode, severe, recurrent.   SECONDARY DIAGNOSES:  AXIS I: OCD.   AXIS II: Deferred.   AXIS III: No diagnosis.   AXIS IV: Severe. Homeless, lack of resources and support.   AXIS V: Functioning at time of  evaluation: 25.   ____________________________ Gonzella Lex, MD jtc:bjt D: 11/27/2011 14:44:31 ET T: 11/27/2011 14:59:43 ET JOB#: [id number]  cc: Gonzella Lex, MD, <Dictator> Gonzella Lex MD ELECTRONICALLY SIGNED 11/28/2011 11:42

## 2015-02-11 NOTE — Consult Note (Signed)
PATIENT NAME:  Omar Foster, Omar Foster MR#:  [id number] DATE OF BIRTH:  1951/10/12  DATE OF CONSULTATION:  11/24/2011  REFERRING PHYSICIAN:   CONSULTING PHYSICIAN:  Audery Amel, MD  IDENTIFYING INFORMATION AND REASON FOR CONSULTATION: This is a 64 year old man admitted to the hospital on 11/09/2011 with a diagnosis of depression and obsessive-compulsive disorder. Consult is to consider electroconvulsive therapy.   HISTORY OF PRESENT ILLNESS: Information obtained from the patient and from the chart. The patient reports that at the time of coming into the hospital, he had been having depressed mood and intrusive obsessive thoughts which have been present for many months, probably over a year. Symptoms have been getting progressively worse. He reports that his mood stays down much of the time. He also gives a very prominent history of anxiety symptoms. In the interview I had with him, he emphasized his anxiety symptoms as being more prominent than his depressed symptoms. He reported that he had thoughts that would come into his head that he could not get out, that were very disturbing to him. These would involve killing people or killing himself. He reported to me that he actually did not feel violent, did not want to hurt anyone else, and did not actually want to die, but would have these intrusive thoughts throughout the day. Additionally, he feels extremely anxious around other people. He reports that his sleep had been fairly adequate, appetite had been okay, and did not have significant physical symptoms to report. He did not report crying spells. He had been out of medication at the time of admission. Since that time, he has been started back on sertraline, which has been titrated up to a dose of 200 mg twice a day, and has been started back on clonazepam and aripiprazole, which were helpful in the past. The patient says that his symptoms have improved, especially his depression, since admission, although  he says he is still having intrusive thoughts of violence and obsessive worry.   PAST PSYCHIATRIC HISTORY: The patient reports many years of these symptoms starting in mid life. He has had serious suicide attempts in the past. He has been treated with multiple medications, but he says that a combination of Zoloft and aripiprazole was the most helpful in the past. He has never had electroconvulsive therapy in the past.   SOCIAL HISTORY: The patient has been married, but he is divorced. Very limited social contacts. He lives with a friend of his. It is working only intermittently and often not at all.   FAMILY HISTORY: No known family history of mental illness.   SUBSTANCE ABUSE HISTORY: The patient reports that he used to abuse drugs and drank in the past, but gave it up years ago.   PAST MEDICAL HISTORY: The patient has no significant known ongoing medical problems. He does not have high blood pressure, does not have heart disease, and has no history of stroke. No history of neurologic disorder or cardiac arrhythmia. He has no history of surgery to his scalp or brain.   REVIEW OF SYSTEMS: In my interview, the patient complained primarily of anxiety. He complained of social anxiety and difficulty being around other people. He complained of feeling frequently that people were watching him or were being critical of him.  He also complained of intrusive obsessive ideas that were ego dystonic. He stated that his mood had improved and he was not feeling as sad as he was and had decided that he did not actually what to kill  himself anymore, but he was still feeling very anxious.   MENTAL STATUS EXAM: Casually dressed, neatly groomed man who looks his stated age. Cooperative and pleasant during the interview. Good eye contact. Limited psychomotor activity. Speech was normal in tone and production and rate. Affect was somewhat blunted and reserved. Mood was stated as being anxious. Thoughts were generally lucid  and directed with no evidence of thought disorder or bizarre thinking. He showed good insight into his obsessive thoughts. He did not report acute auditory or visual hallucinations. He denied any actual wish to die. He denied any homicidal wish or intent, but said that he did have obsessive thoughts about both of them.   ASSESSMENT: This is 64 year old gentleman who was admitted with a combination of severe depressive and anxiety symptoms. He has been medication noncompliant and not engaged in appropriate outpatient treatment. Since being in the hospital, he has had improvement in his depressive symptoms, but he continues to complain of marked anxiety symptoms. His depression is certainly not 100 percent better, but it seems to be better than it was when he was admitted. He reports that this is about the result he has had with treatment in the past as well, that is to say he has had improvement in depressive symptoms but has continued to suffer from anxiety symptoms. The patient stated that as far as willingness, that he would be willing to get "whatever would help me", as far as treatment. He was interested in and had a lucid and engaging discussion about electroconvulsive therapy. At this point, I think it is questionable whether he would be an appropriate patient for ECT. The primary symptoms that he is currently suffering from are more anxiety than depression. ECT does not have a prominent track record in treating OCD or other anxiety disorders, although there are certainly case reports of improvement of this type. Most of the case reports stem, from what I have seen, to be in patients who have both mood disorders and anxiety disorders at the same time. He does have only partial improvement of his depression, but at this point he denies being acutely suicidal and is not behaving in a bizarre or agitated way, and he appears to be able to make reasonable decisions.   TREATMENT PLAN: I informed the patient that  I was uncertain as to whether ECT would be an appropriate treatment for him. I think that probably it would be better to defer the decision at this point and possibly consider it later after this hospitalization. I would, however, be open to continuing to discuss this with the patient's primary psychiatrist and with the patient.   DIAGNOSIS PRINCIPLE AND PRIMARY:   AXIS I: Major depression, severe, recurrent.   SECONDARY DIAGNOSES:   AXIS I: Obsessive compulsive disorder.   AXIS II: Deferred.   AXIS III: No diagnosis.   AXIS IV: Severe - ongoing stress from lack of outpatient care and social resources.   AXIS V: Functioning at time of evaluation 45 to 50. ____________________________ Audery AmelJohn T. Clapacs, MD jtc:slb D: 11/24/2011 21:24:56 ET T: 11/25/2011 09:32:45 ET JOB#: [id number]  cc: Audery AmelJohn T. Clapacs, MD, <Dictator> Audery AmelJOHN T CLAPACS MD ELECTRONICALLY SIGNED 11/26/2011 10:43

## 2015-02-11 NOTE — Discharge Summary (Signed)
PATIENT NAME:  Omar Foster, Omar Foster MR#:  [id number] DATE OF BIRTH:  1950-10-31  DATE OF ADMISSION:  11/09/2011 DATE OF DISCHARGE:  12/16/2011  HOSPITAL COURSE: The patient was admitted through the Emergency Room for severe depression with suicidal ideation, intrusive suicidal thoughts, intrusive homicidal thoughts, excessive worry, inability to function outside the hospital. He was initially on Dr. Providence Crosby service and Dr. Jeanie Sewer treated him aggressively with antidepressants and antipsychotics. The patient's history was that he had had medical treatment with multiple appropriate trials of antidepressants in the past and had not had significant improvement. The patient was referred to me for electroconvulsive therapy and I saw him for the first time on February 11th. The patient was educated about ECT and I felt that given his history he would be an appropriate candidate. The patient was interested and agreed to a trial of ECT. He began ECT the second week of February. He continued to have ECT up through February 22nd. He showed dramatic improvement with a reduction in his depressive symptoms and his anxiety. Over several treatments he showed resolution of his suicidal thoughts. He became more outgoing and interactive. His affect became more upbeat. He would smile, interact with others, participate well, became more animated. There was no sign of mania. Unfortunately, he developed a significant amount of confusion from his ECT and memory loss. ECT was discontinued shortly prior to his discharge because of confusion with memory loss and frequent disorientation. He was given a few days without ECT and showed improvement. He became oriented during the day. He still had some recent memory loss but was not having ongoing confusion. He still occasionally became confused at nighttime. It was decided that he would be discharged home and that we would then consider follow-up possibly with maintenance ECT once we had  seen how he had recovered at home. He was discharged on February 26th, however, at that point he was still having a mild degree of confusion particularly at night. His mood, however, had improved dramatically. There was no suicidality or homicidality at all and he was feeling physically well.   LABORATORY RESULTS: Admission labs showed drug screen negative. TSH normal. Alcohol level 0. Chemistry panel all normal. CBC normal. Urinalysis unremarkable. He had workup for ECT including an EKG done which showed normal sinus rhythm unremarkable. No other significant lab values.   DISCHARGE MEDICATIONS:  1. Abilify 10 mg per day.  2. Zoloft 100 mg twice a day. 3. Klonopin half mg q.6 hours p.r.n. anxiety.   MENTAL STATUS EXAM AT DISCHARGE: Neatly dressed and groomed. Good eye contact. Normal psychomotor activity. Affect a little bit constricted but not severely so, not sad or depressed. Mood stated as good. Thoughts generally appeared lucid and directed. No sign of loosening of associations or delusional thinking. He was alert and oriented x4. He was understanding of the treatment situation and the treatment plan. Denied any suicidal or homicidal ideation at all. No intrusive thoughts reported. No excessive anxiety.   DISPOSITION: He was discharged home with his friends with follow-up to be arranged for further evaluation for possible maintenance ECT in the future.   DIAGNOSES PRINCIPLE AND PRIMARY:  AXIS I: Major depression, severe, recurrent.   SECONDARY DIAGNOSES:  AXIS I: OCD.   AXIS II: No diagnosis.   AXIS III: No diagnosis.   AXIS IV: Moderate. Chronic stress from decreased socialization, decreased resources.   AXIS V: Functioning at time of discharge 48.   ____________________________ Audery Amel, MD jtc:drc D: 12/26/2011 11:38:54  ET T: 12/26/2011 11:50:57 ET JOB#: [id number]  cc: Audery AmelJohn T. Clapacs, MD, <Dictator> Audery AmelJOHN T CLAPACS MD ELECTRONICALLY SIGNED 12/26/2011 14:05
